# Patient Record
Sex: Female | Born: 1995 | Hispanic: Yes | Marital: Single
Health system: Midwestern US, Academic
[De-identification: ages and names within clinical notes are randomized; demographics above are authoritative.]

---

## 2017-03-09 ENCOUNTER — Encounter: Admit: 2017-03-09 | Discharge: 2017-03-10 | Payer: BC Managed Care – PPO

## 2017-03-10 ENCOUNTER — Encounter: Admit: 2017-03-10 | Discharge: 2017-03-10 | Payer: BC Managed Care – PPO

## 2017-03-10 DIAGNOSIS — K851 Biliary acute pancreatitis without necrosis or infection: Secondary | ICD-10-CM

## 2017-03-10 DIAGNOSIS — S35299A Unspecified injury of branches of celiac and mesenteric artery, initial encounter: ICD-10-CM

## 2017-03-11 ENCOUNTER — Encounter: Admit: 2017-03-11 | Discharge: 2017-03-11 | Payer: BC Managed Care – PPO

## 2017-03-11 ENCOUNTER — Encounter: Admit: 2017-03-11 | Discharge: 2017-03-12 | Payer: BC Managed Care – PPO

## 2017-03-11 ENCOUNTER — Inpatient Hospital Stay: Admit: 2017-03-11 | Discharge: 2017-03-11 | Payer: BC Managed Care – PPO

## 2017-03-11 DIAGNOSIS — R1011 Right upper quadrant pain: Secondary | ICD-10-CM

## 2017-03-11 LAB — COMPREHENSIVE METABOLIC PANEL
Lab: 0.6 mg/dL (ref 0.4–1.00)
Lab: 1.8 mg/dL — ABNORMAL HIGH (ref 0.3–1.2)
Lab: 140 MMOL/L (ref 137–147)
Lab: 6 g/dL — ABNORMAL HIGH (ref 6.0–8.0)
Lab: 8.4 mg/dL — ABNORMAL LOW (ref 8.5–10.6)
Lab: 9 mg/dL (ref 7–25)
Lab: 95 mg/dL (ref 70–100)

## 2017-03-11 LAB — PREGNANCY TEST-URINE
Lab: 1
Lab: NEGATIVE

## 2017-03-11 LAB — PHOSPHORUS: Lab: 2.1 mg/dL (ref 2.0–4.0)

## 2017-03-11 LAB — PROTIME INR (PT): Lab: 1.4 M/UL — ABNORMAL HIGH (ref 0.8–1.2)

## 2017-03-11 LAB — LACTIC ACID (BG - RAPID LACTATE): Lab: 0.9 MMOL/L (ref 0.5–2.0)

## 2017-03-11 LAB — CBC AND DIFF: Lab: 14 10*3/uL — ABNORMAL HIGH (ref 4.5–11.0)

## 2017-03-11 LAB — MAGNESIUM: Lab: 1.8 mg/dL (ref 1.6–2.6)

## 2017-03-11 LAB — PTT (APTT): Lab: 32 s — ABNORMAL LOW (ref 20.0–36.0)

## 2017-03-11 MED ORDER — SODIUM CHLORIDE 0.9 % IR SOLN
0 refills | Status: DC
Start: 2017-03-11 — End: 2017-03-13
  Administered 2017-03-12: 02:00:00 35 mL

## 2017-03-11 MED ORDER — PIPERACILLIN-TAZOBACTAM-DEXTRS 4.5 GRAM/100 ML IV PGBK
4.5 g | INTRAVENOUS | 0 refills | Status: DC
Start: 2017-03-11 — End: 2017-03-15
  Administered 2017-03-11 – 2017-03-15 (×15): 4.5 g via INTRAVENOUS

## 2017-03-11 MED ORDER — IOPAMIDOL 61 % IV SOLN
0 refills | Status: DC
Start: 2017-03-11 — End: 2017-03-13
  Administered 2017-03-12: 02:00:00 35 mL via INTRAMUSCULAR
  Administered 2017-03-13: 02:00:00 50 mL via INTRAMUSCULAR

## 2017-03-11 MED ORDER — CEFTRIAXONE 1 GRAM IJ SOLR
0 refills | Status: DC
Start: 2017-03-11 — End: 2017-03-11
  Administered 2017-03-11: 22:00:00 1 g via INTRAVENOUS

## 2017-03-11 MED ORDER — MORPHINE 2 MG/ML IV CRTG
2-4 mg | INTRAVENOUS | 0 refills | Status: DC | PRN
Start: 2017-03-11 — End: 2017-03-16
  Administered 2017-03-11 (×2): 2 mg via INTRAVENOUS
  Administered 2017-03-11 (×2): 4 mg via INTRAVENOUS
  Administered 2017-03-12 (×2): 2 mg via INTRAVENOUS
  Administered 2017-03-13 (×4): 4 mg via INTRAVENOUS
  Administered 2017-03-14: 15:00:00 2 mg via INTRAVENOUS
  Administered 2017-03-14 (×2): 4 mg via INTRAVENOUS
  Administered 2017-03-15 (×2): 2 mg via INTRAVENOUS

## 2017-03-11 MED ORDER — LACTATED RINGERS IV SOLP
2000 mL | Freq: Once | INTRAVENOUS | 0 refills | Status: CP
Start: 2017-03-11 — End: ?
  Administered 2017-03-11: 12:00:00 2000 mL via INTRAVENOUS

## 2017-03-11 MED ORDER — LACTATED RINGERS IV SOLP
1000 mL | INTRAVENOUS | 0 refills | Status: DC
Start: 2017-03-11 — End: 2017-03-12
  Administered 2017-03-11: 22:00:00 1000.000 mL via INTRAVENOUS
  Administered 2017-03-11 – 2017-03-12 (×3): 1000 mL via INTRAVENOUS

## 2017-03-11 MED ORDER — LIDOCAINE (PF) 200 MG/10 ML (2 %) IJ SYRG
0 refills | Status: DC
Start: 2017-03-11 — End: 2017-03-11
  Administered 2017-03-11: 22:00:00 80 mg via INTRAVENOUS

## 2017-03-11 MED ORDER — FENTANYL CITRATE (PF) 50 MCG/ML IJ SOLN
50 ug | INTRAVENOUS | 0 refills | Status: DC | PRN
Start: 2017-03-11 — End: 2017-03-12
  Administered 2017-03-11: 23:00:00 50 ug via INTRAVENOUS

## 2017-03-11 MED ORDER — PROMETHAZINE 25 MG/ML IJ SOLN
6.25 mg | INTRAVENOUS | 0 refills | Status: DC | PRN
Start: 2017-03-11 — End: 2017-03-12

## 2017-03-11 MED ORDER — PROPOFOL INJ 10 MG/ML IV VIAL
0 refills | Status: DC
Start: 2017-03-11 — End: 2017-03-11
  Administered 2017-03-11 (×2): 20 mg via INTRAVENOUS
  Administered 2017-03-11: 22:00:00 160 mg via INTRAVENOUS

## 2017-03-11 MED ORDER — PHENOL 1.4 % MM SPRA
2 | OROMUCOSAL | 0 refills | Status: DC | PRN
Start: 2017-03-11 — End: 2017-03-16
  Administered 2017-03-12: 02:00:00 2 via OROMUCOSAL

## 2017-03-11 MED ORDER — HYDROMORPHONE (PF) 2 MG/ML IJ SYRG
.5 mg | INTRAVENOUS | 0 refills | Status: DC | PRN
Start: 2017-03-11 — End: 2017-03-12

## 2017-03-11 MED ORDER — SODIUM CHLORIDE 0.9 % IV SOLP
1000 mL | INTRAVENOUS | 0 refills | Status: DC
Start: 2017-03-11 — End: 2017-03-11

## 2017-03-11 MED ORDER — PHENYLEPHRINE IN 0.9% NACL(PF) 1 MG/10 ML (100 MCG/ML) IV SYRG
INTRAVENOUS | 0 refills | Status: DC
Start: 2017-03-11 — End: 2017-03-11
  Administered 2017-03-11: 22:00:00 150 ug via INTRAVENOUS

## 2017-03-11 MED ORDER — SUCCINYLCHOLINE CHLORIDE 20 MG/ML IJ SOLN
INTRAVENOUS | 0 refills | Status: DC
Start: 2017-03-11 — End: 2017-03-11
  Administered 2017-03-11: 22:00:00 100 mg via INTRAVENOUS

## 2017-03-11 MED ORDER — ONDANSETRON HCL (PF) 4 MG/2 ML IJ SOLN
INTRAVENOUS | 0 refills | Status: DC
Start: 2017-03-11 — End: 2017-03-11
  Administered 2017-03-11: 22:00:00 4 mg via INTRAVENOUS

## 2017-03-11 MED ORDER — ONDANSETRON HCL (PF) 4 MG/2 ML IJ SOLN
4 mg | Freq: Once | INTRAVENOUS | 0 refills | Status: DC | PRN
Start: 2017-03-11 — End: 2017-03-12

## 2017-03-11 MED ORDER — DEXAMETHASONE SODIUM PHOSPHATE 4 MG/ML IJ SOLN
INTRAVENOUS | 0 refills | Status: DC
Start: 2017-03-11 — End: 2017-03-11
  Administered 2017-03-11: 22:00:00 4 mg via INTRAVENOUS

## 2017-03-11 MED ORDER — DEXTRAN 70-HYPROMELLOSE (PF) 0.1-0.3 % OP DPET
0 refills | Status: DC
Start: 2017-03-11 — End: 2017-03-11
  Administered 2017-03-11: 22:00:00 2 [drp] via OPHTHALMIC

## 2017-03-11 MED ORDER — PROCHLORPERAZINE EDISYLATE 5 MG/ML IJ SOLN
10 mg | INTRAVENOUS | 0 refills | Status: DC | PRN
Start: 2017-03-11 — End: 2017-03-16
  Administered 2017-03-11 – 2017-03-13 (×2): 10 mg via INTRAVENOUS

## 2017-03-11 MED ORDER — ACETAMINOPHEN 325 MG PO TAB
650 mg | ORAL | 0 refills | Status: DC | PRN
Start: 2017-03-11 — End: 2017-03-13
  Administered 2017-03-11 – 2017-03-12 (×3): 650 mg via ORAL

## 2017-03-11 MED ORDER — FENTANYL CITRATE (PF) 50 MCG/ML IJ SOLN
0 refills | Status: DC
Start: 2017-03-11 — End: 2017-03-11
  Administered 2017-03-11: 22:00:00 50 ug via INTRAVENOUS
  Administered 2017-03-11: 22:00:00 25 ug via INTRAVENOUS
  Administered 2017-03-11: 22:00:00 100 ug via INTRAVENOUS
  Administered 2017-03-11: 22:00:00 25 ug via INTRAVENOUS
  Administered 2017-03-11: 22:00:00 50 ug via INTRAVENOUS

## 2017-03-11 MED ORDER — SODIUM CHLORIDE 0.9 % IV SOLP
INTRAVENOUS | 0 refills | Status: CN
Start: 2017-03-11 — End: ?

## 2017-03-11 MED ORDER — IBUPROFEN 600 MG PO TAB
600 mg | ORAL | 0 refills | Status: DC | PRN
Start: 2017-03-11 — End: 2017-03-11

## 2017-03-11 NOTE — Progress Notes
Patient arrived to room # [phone]([id number]) via cart accompanied by RN. Patient transferred to the bed without assistance. Bedside safety checks completed. Initial patient assessment completed, refer to flowsheet for details. Admission skin assessment completed by: Eulogio BearStephanie A, RN and Orlena SheldonKeyonna A    Pressure Injury Present on Hospital Admission (within 24 hours): No    1. Occiput: No  2. Ear: No  3. Scapula: No  4. Spinous Process: No  5. Shoulder: No  6. Elbow: No  7. Iliac Crest: No  8. Sacrum/Coccyx: No  9. Ischial Tuberosity: No  10. Trochanter: No  11. Knee: No  12. Malleolus: No  13. Heel: No  14. Toes: No  15. Assessed for device associated injury Yes  16. Nursing Nutrition Assessment Completed Yes    See Doc Flowsheet for additional wound details.     INTERVENTIONS: N/A

## 2017-03-11 NOTE — Care Coordination-Inpatient
Transferring from:  Hannah Juarez    Reason for transfer: gallstone pancreatitis    Ms. Hannah Juarez os a 21 yo F, 2 weeks post-partum found to have gallstone pancreatitis.  She is thought to need an ERCP and the sending physician spoke with [facility] on-call who felt she was appropriate for transfer.  Lipase > 8,000, ast 251, alt 321, bilirubin 4.1, wbc 12.8  She is on Zosyn. CT abd and abdominal US being uploaded to the cloud    Consider keeping NPO after midnight, continuing zosyn.  Place GI/Biliary consult.    NOTE:    Radiology and medical records will be sent with the patient.    The above information was obtained via conversation by the  transfer nurse with the sending provider.  Treatment decisions should not be made solely on the basis of this information

## 2017-03-11 NOTE — H&P (View-Only)
Pre Procedure History and Physical/Sedation Plan    Hannah, Juarez                                                                   MRN: [id number]                 DOB:1995-12-05          Age: 21 y.o.  Admission Date: 03/11/2017             Days Admitted: LOS: 0 days      LOS: 0 days    Assessment:         Planned Procedure(s):  03/11/17  Indication:  Gallstone pancreatitis.   Sedation/Medication Plan: General Anesthesia  Discussion/Reviews:  Physician has discussed risks and alternatives of this type of sedation and above planned procedures with patient  Notes:     __________________________________________________________________    Principal Problem:    Pancreatitis due to biliary obstruction  Active Problems:    Cholecystitis    Sepsis (HCC)      Chief Complaint:  Pancreatitis and abdominal pain and jaundice.     History of Present Illness: Hannah Juarez, Hannah Juarez is a 21 y.o. female. With gallstone pancreatitis and juandice.     Previous Anesthetic/Sedation History:  Reviewed.     History reviewed. No pertinent past medical history.  History reviewed. No pertinent surgical history.  Pertinent medical/surgical history reviewed  Social History     Social History   ??? Marital status: N/A     Spouse name: N/A   ??? Number of children: N/A   ??? Years of education: N/A     Social History Main Topics   ??? Smoking status: Never Smoker   ??? Smokeless tobacco: Never Used   ??? Alcohol use No   ??? Drug use: No   ??? Sexual activity: Not on file     Other Topics Concern   ??? Not on file     Social History Narrative   ??? No narrative on file     Family history reviewed; non-contributory  Allergies:  Patient has no known allergies.  Medications:  Scheduled Meds:  piperacillin/tazobactam  (ZOSYN) 4.5 g/100 mL iso-osmotic IVPB 4.5 g Intravenous Q6H*   Continuous Infusions:  ??? lactated ringers infusion 1,000 mL (03/11/17 1045)     PRN and Respiratory Meds:acetaminophen Q4H PRN, morphine  injection syringe Q3H PRN, prochlorperazine Q6H PRN Review of Systems:  Gastrointestinal: negative, positive for abdominal pain and jaundice     Vital Signs:  Last Filed Vital Signs: 24 Hour Range   BP: 123/73 (09/01 1525)  Temp: 38.1 ???C (100.5 ???F) (09/01 1525)  Pulse: 117 (09/01 1525)  Respirations: 16 PER MINUTE (09/01 1525)  SpO2: 94 % (09/01 1525)  O2 Delivery: None (Room Air) (09/01 1525)  Height: 167.6 cm (66) (09/01 0522) BP: (121-126)/(66-78)   Temp:  [38 ???C (100.4 ???F)-39.4 ???C (102.9 ???F)]   Pulse:  [110-134]   Respirations:  [16 PER MINUTE-20 PER MINUTE]   SpO2:  [92 %-95 %]   O2 Delivery: None (Room Air)   Intensity Pain Scale 0-10 (Pain 1): 5 (03/11/17 1610)  Physical Exam:  General:  Alert, cooperative, no distress, appears stated age    Airway:  airway assessment performed  Mallampati II (soft palate, uvula, fauces visible)  Anesthesia Classification:  ASA III (A patient with a severe systemic disease that limits activity, but is not incapacitating)  NPO Status: Acceptable  Pregnancy Status: Not Pregnant    Lab/Radiology/Other Diagnostic Tests:  Labs:  Pertinent labs reviewed  CXR:  Not obtained  Consults: Not obtained    Bernita Buffy, MD  Pager [phone]

## 2017-03-11 NOTE — Anesthesia Post-Procedure Evaluation
Post-Anesthesia Evaluation    Name: Herschel SenegalOlvia Lopez,Eleasha      MRN: [id number]     DOB: 1996/07/06     Age: 21 y.o.     Sex: female   __________________________________________________________________________     Procedure Date: 03/11/2017  Procedure: Procedure(s):  ESOPHAGOGASTRODUODENOSCOPY ENDOSCOPIC ULTRASOUND  CHOLANGIOPANCREATOGRAPHY ENDOSCOPY RETROGRADE      Surgeon: Moishe SpiceSurgeon(s):  Bernita Buffylyaee, Mojtaba, MD    Post-Anesthesia Vitals  BP: 127/68 (09/01 1830)  Temp: 37.9 C (100.2 F) (09/01 1815)  Pulse: 110 (09/01 1830)  Respirations: 24 PER MINUTE (09/01 1830)  SpO2: 100 % (09/01 1830)  O2 Delivery: None (Room Air) (09/01 1830)  SpO2 Pulse: 109 (09/01 1830)      Post Anesthesia Evaluation Note    Evaluation location: Pre/Post  Patient participation: recovered; patient participated in evaluation  Level of consciousness: alert    Pain score: 0  Pain management: adequate    Hydration: normovolemia  Temperature: 36.0C - 38.4C  Airway patency: adequate    Perioperative Events  Perioperative events:  no       Post-op nausea and vomiting: no PONV    Postoperative Status  Cardiovascular status: hemodynamically stable  Respiratory status: spontaneous ventilation        Perioperative Events  Perioperative Event: No  Emergency Case Activation: No

## 2017-03-11 NOTE — Consults
[facility] Consult  03/11/2017     Patient: Hannah Juarez, Hannah Juarez  MRN: [id number]    Admission Date:  03/11/2017, LOS: 0 days  Admission Diagnosis: Acute biliary pancreatitis, unspecified complication status [K85.10]  Date of Service: March 11, 2017    Reason for Consult: Gallstone pancreatitis  Referring Provider: Luster Landsberg, MD  Attending Surgeon: Marin Comment, MD  Consult Performed by: Dalphine Handing, MD    Assessment/Plan  21 y.o. female who is 2 weeks post-partum with gallstone pancreatitis    OR date: 03/11/2017    Principal Problem:    Pancreatitis due to biliary obstruction  Active Problems:    Cholecystitis    Sepsis (HCC)      - Agree with antibiotics and ERCP today for possible stone extraction  - Recommend  continuing to trend bili and lipase to ensure levels are coming down  - Recommend getting images from OSH transferred   - Patient will need a cholecystectomy - possibly early this week    Discussed patient with Dr. Barbera Setters who directed plan of care  __________________________________________________________________________________    HPI   Hannah, Juarez is a 21 y.o. female with a two day history of RUQ abdominal pain. She reports the pain started in the morning and was not related to eating. She also had nausea/vomiting and decreased appetite. She presented to an OSH and was reportedly found to have an elevated lipase (2444), WBC 12.8, and Tbili 4.2. US/CT at OSH consistent with cholecystitis and pancreatitis (images currently not available). She reports that her urine is darker but she hasn't had any changes in her stools.    History reviewed. No pertinent past medical history.  History reviewed. No pertinent surgical history.  Family History   Problem Relation Age of Onset   ??? None Reported Mother    ??? None Reported Father      Social History     Social History   ??? Marital status: N/A     Spouse name: N/A   ??? Number of children: N/A   ??? Years of education: N/A Social History Main Topics   ??? Smoking status: Never Smoker   ??? Smokeless tobacco: Never Used   ??? Alcohol use No   ??? Drug use: No   ??? Sexual activity: Not on file     Other Topics Concern   ??? Not on file     Social History Narrative   ??? No narrative on file       Patient has no known allergies.    No current facility-administered medications on file prior to encounter.      No current outpatient prescriptions on file prior to encounter.       ROS  Review of Systems   Constitutional: Positive for diaphoresis. Negative for chills and fever.   HENT: Negative.    Eyes: Negative.    Respiratory: Negative.    Cardiovascular: Negative.    Gastrointestinal: Positive for abdominal pain, nausea and vomiting. Negative for blood in stool, constipation and diarrhea.   Genitourinary: Negative.    Musculoskeletal: Negative.    Skin: Negative.    Neurological: Negative.  Negative for weakness.   Endo/Heme/Allergies: Negative.    Psychiatric/Behavioral: Negative.        Vitals  BP: (121-126)/(66-78)   Temp:  [38 ???C (100.4 ???F)-39.4 ???C (102.9 ???F)]   Pulse:  [110-134]   Respirations:  [18 PER MINUTE-20 PER MINUTE]   SpO2:  [92 %-95 %]   O2 Delivery: None (  Room Air)    Physical Exam   General: alert, oriented, NAD  HEENT: normocephalic/atraumatic, non-icteric  Cardio: mild tachycardia  Pulm: non-labored respirations on RA  Abd: soft, non-distended, mild TTP in RUQ with voluntary guarding     Ext: warm, dry, no edema/cyanosis   Neuro: grossly intact  Psych: behavior and mood appropriate      Labs/Radiology/Other Diagnostic Tests  Lab Results   Component Value Date/Time    HGB 11.7 (L) 03/11/2017 0620    WBC 14.0 (H) 03/11/2017 0620     Lab Results   Component Value Date/Time    NA 140 03/11/2017 0620    K 3.5 03/11/2017 0620    CL 106 03/11/2017 0620    CO2 24 03/11/2017 0620    BUN 9 03/11/2017 0620    CR 0.65 03/11/2017 8295       Lise Auer, MD  [phone]    Team pager [phone]

## 2017-03-11 NOTE — H&P (View-Only)
Admission History and Physical Examination      Name:  Hannah Juarez, Hannah Juarez                                             MRN:  [id number]   Admission Date:  03/11/2017                     Assessment/Plan:    Principal Problem:    Pancreatitis due to biliary obstruction    Gallstone Pancreatitis  -presented with RUQ abd pain, NV, elevated lipase  -Labs from 8/31: Tbili 4.2, Ap 234, AST 251,ALT321, lipase 2444, WBC 12.8,  -[place] XBJ:[id number] with gallbladder wall thickening and pericholecystic fluid with positive sonographic murphy sign. Findings consistet withh cholecystitis  -CT abd/pelvis:diffusely edematous pancreatic parenchyma with surrounding inflammatory stranding, consistent with acute interstitial edematous pancreatitis. No necrosis. No fluid collections. Distended gallbladder. No biliary ductal dilatation.   -CTA chest: no PE  Plan:  -will consult gastro-biliary for possible ERCP. Patient is NPO  -continue zosyn  -will obtain BCx  -tylenol, ibuprofen, IV morphine prn for pain  -IV compazine prn for nausea    FEN:LR @100ml /hr for 2 L, replete as needed, NPO  Code:Full  Dispo: admit to medicine  PPX: hold chemical PPX in case of procedure  __________________________________________________________________________________  Primary Care Physician: No primary care provider on file.  PCP Unknown    Chief Complaint:  Gallstone panccreatitis  History of Present Illness: Hannah, Juarez is a 21 y.o. female with no significant PMHx, 2 weeks postpartum, presenting as transfer from for management of gallstone pancreatitis. She initally presented with RUQ abdominal pain, nausea, vomiting(non-bloody). She was found to have a lipase of 8000. Ct showed evidence of [ericholecystic fluid and thickening with CBD 3.12mm. Bilirubin and LFTs were elevated and she had leukocytosis. GI was consulted and recommended ERCP.     History reviewed. No pertinent past medical history. History reviewed. No pertinent surgical history.  Family history reviewed; non-contributory  Social History     Social History   ??? Marital status: N/A     Spouse name: N/A   ??? Number of children: N/A   ??? Years of education: N/A     Social History Main Topics   ??? Smoking status: Never Smoker   ??? Smokeless tobacco: Never Used   ??? Alcohol use No   ??? Drug use: No   ??? Sexual activity: Not on file     Other Topics Concern   ??? Not on file     Social History Narrative   ??? No narrative on file      Immunizations (includes history and patient reported):   There is no immunization history on file for this patient.        Allergies:  Patient has no allergy information on record.    Medications:  No prescriptions prior to admission.     Review of Systems:  A 14 point review of systems was negative except for: Gastrointestinal: positive for abdominal pain    Physical Exam:  Vital Signs: Last Filed In 24 Hours Vital Signs: 24 Hour Range   BP: 126/66 (09/01 0522)  Temp: 39.4 ???C (102.9 ???F) (09/01 0522)  Pulse: 131 (09/01 0522)  Respirations: 20 PER MINUTE (09/01 0522)  SpO2: 93 % (09/01 0522)  O2 Delivery: None (Room Air) (09/01 0522)  Height: 167.6 cm (66) (09/01  0522) BP: (126)/(66)   Temp:  [39.4 ???C (102.9 ???F)]   Pulse:  [131]   Respirations:  [20 PER MINUTE]   SpO2:  [93 %]   O2 Delivery: None (Room Air)   Intensity Pain Scale 0-10 (Pain 1): 6 (03/11/17 0500)      General:  Alert, cooperative, no distress, appears stated age  Head:  Normocephalic, without obvious abnormality, atraumatic  Eyes:  Conjunctivae/corneas clear.  PERRL, EOMs intact.   Neck:    Supple,  Lungs:  Clear to auscultation bilaterally  Heart:   Regular rate and rhythm, S1, S2 normal,tachycardic  Abdomen:  Soft,mild tenderness to palpation of RUQ  Extremities: Extremities normal, atraumatic, no cyanosis or edema  Peripheral pulses   2+ and symmetric, all extremities  Skin: Skin color, texture, turgor normal.  No rashes or lesions Neurologic:   CNII - XII intact.   Psych:  Appropriate affect and mood    Lab/Radiology/Other Diagnostic Tests:  24-hour labs:  No results found for this visit on 03/11/17 (from the past 24 hour(s)).     Pertinent radiology reviewed.    Mliss Fritz, MD  Pager

## 2017-03-12 ENCOUNTER — Encounter: Admit: 2017-03-12 | Discharge: 2017-03-12 | Payer: BC Managed Care – PPO

## 2017-03-12 ENCOUNTER — Inpatient Hospital Stay: Admit: 2017-03-12 | Discharge: 2017-03-12 | Payer: BC Managed Care – PPO

## 2017-03-12 DIAGNOSIS — R1011 Right upper quadrant pain: Secondary | ICD-10-CM

## 2017-03-12 LAB — COMPREHENSIVE METABOLIC PANEL
Lab: 0.5 mg/dL (ref 0.4–1.00)
Lab: 0.9 mg/dL (ref 0.3–1.2)
Lab: 107 MMOL/L (ref 98–110)
Lab: 131 U/L — ABNORMAL HIGH (ref 25–110)
Lab: 140 MMOL/L (ref 137–147)
Lab: 2.9 g/dL — ABNORMAL LOW (ref 3.5–5.0)
Lab: 3.3 MMOL/L — ABNORMAL LOW (ref 3.5–5.1)
Lab: 38 U/L (ref 7–40)
Lab: 5.6 g/dL — ABNORMAL LOW (ref 6.0–8.0)
Lab: 8.1 mg/dL — ABNORMAL LOW (ref 8.5–10.6)
Lab: 9 mg/dL (ref 7–25)
Lab: 93 mg/dL (ref 70–100)

## 2017-03-12 LAB — LIPASE: Lab: 80 U/L (ref 11–82)

## 2017-03-12 LAB — CBC
Lab: 12 10*3/uL — ABNORMAL HIGH (ref 4.5–11.0)
Lab: 14 % (ref 11–15)
Lab: 226 10*3/uL (ref 150–400)
Lab: 28 pg (ref 26–34)
Lab: 3.3 M/UL — ABNORMAL LOW (ref 4.0–5.0)
Lab: 9.7 g/dL — ABNORMAL LOW (ref 12.0–15.0)

## 2017-03-12 MED ORDER — PHENYLEPHRINE IV DRIP (STD CONC)
0 refills | Status: DC
Start: 2017-03-12 — End: 2017-03-13
  Administered 2017-03-13 (×2): 0.2 ug/kg/min via INTRAVENOUS

## 2017-03-12 MED ORDER — DIPHENHYDRAMINE HCL 50 MG/ML IJ SOLN
25 mg | Freq: Once | INTRAVENOUS | 0 refills | Status: AC | PRN
Start: 2017-03-12 — End: ?

## 2017-03-12 MED ORDER — ALBUMIN, HUMAN 5 % 250 ML IV SOLP (AN)(OSM)
0 refills | Status: DC
Start: 2017-03-12 — End: 2017-03-13
  Administered 2017-03-12 – 2017-03-13 (×2): via INTRAVENOUS

## 2017-03-12 MED ORDER — BUPIVACAINE LIPOSOMAL/BUPIVACAINE HCL INJECTION
Freq: Once | 0 refills | Status: CP
Start: 2017-03-12 — End: ?
  Administered 2017-03-13 (×2): 60 mL

## 2017-03-12 MED ORDER — PROPOFOL INJ 10 MG/ML IV VIAL
0 refills | Status: DC
Start: 2017-03-12 — End: 2017-03-13
  Administered 2017-03-12: 23:00:00 200 mg via INTRAVENOUS

## 2017-03-12 MED ORDER — SODIUM CHLORIDE 0.9 % IV SOLP
0 refills | Status: DC
Start: 2017-03-12 — End: 2017-03-13
  Administered 2017-03-12 – 2017-03-13 (×2): via INTRAVENOUS

## 2017-03-12 MED ORDER — CALCIUM CHLORIDE 100 MG/ML (10 %) IV SOLN
0 refills | Status: DC
Start: 2017-03-12 — End: 2017-03-13
  Administered 2017-03-13 (×2): 500 mg via INTRAVENOUS

## 2017-03-12 MED ORDER — ROCURONIUM 10 MG/ML IV SOLN
INTRAVENOUS | 0 refills | Status: DC
Start: 2017-03-12 — End: 2017-03-13
  Administered 2017-03-12: 23:00:00 50 mg via INTRAVENOUS
  Administered 2017-03-12: 23:00:00 20 mg via INTRAVENOUS
  Administered 2017-03-13: 01:00:00 50 mg via INTRAVENOUS

## 2017-03-12 MED ORDER — HALOPERIDOL LACTATE 5 MG/ML IJ SOLN
1 mg | Freq: Once | INTRAVENOUS | 0 refills | Status: AC | PRN
Start: 2017-03-12 — End: ?

## 2017-03-12 MED ORDER — SUCCINYLCHOLINE CHLORIDE 20 MG/ML IJ SOLN
INTRAVENOUS | 0 refills | Status: DC
Start: 2017-03-12 — End: 2017-03-13
  Administered 2017-03-12: 23:00:00 120 mg via INTRAVENOUS

## 2017-03-12 MED ORDER — PHENYLEPHRINE IN 0.9% NACL(PF) 1 MG/10 ML (100 MCG/ML) IV SYRG
INTRAVENOUS | 0 refills | Status: DC
Start: 2017-03-12 — End: 2017-03-13
  Administered 2017-03-12: 23:00:00 100 ug via INTRAVENOUS
  Administered 2017-03-12 – 2017-03-13 (×3): 200 ug via INTRAVENOUS
  Administered 2017-03-13: 100 ug via INTRAVENOUS
  Administered 2017-03-13: 200 ug via INTRAVENOUS

## 2017-03-12 MED ORDER — FENTANYL CITRATE (PF) 50 MCG/ML IJ SOLN
50 ug | INTRAVENOUS | 0 refills | Status: DC | PRN
Start: 2017-03-12 — End: 2017-03-13
  Administered 2017-03-13 (×2): 50 ug via INTRAVENOUS

## 2017-03-12 MED ORDER — LIDOCAINE (PF) 200 MG/10 ML (2 %) IJ SYRG
0 refills | Status: DC
Start: 2017-03-12 — End: 2017-03-13
  Administered 2017-03-12: 23:00:00 100 mg via INTRAVENOUS

## 2017-03-12 MED ORDER — ELECTROLYTE-A IV SOLP
0 refills | Status: DC
Start: 2017-03-12 — End: 2017-03-13
  Administered 2017-03-13 (×3): via INTRAVENOUS

## 2017-03-12 MED ORDER — DEXTROSE 5%-0.45% SODIUM CHLORIDE & POTASSIUM CHLORIDE 20 MEQ/L IV SOLP
INTRAVENOUS | 0 refills | Status: AC
Start: 2017-03-12 — End: ?
  Administered 2017-03-12 – 2017-03-14 (×5): 1000.000 mL via INTRAVENOUS

## 2017-03-12 MED ORDER — LIDOCAINE HCL 10 MG/ML (1 %) IJ SOLN
0 refills | Status: DC
Start: 2017-03-12 — End: 2017-03-13
  Administered 2017-03-12: 8.5 mL via INTRAMUSCULAR

## 2017-03-12 MED ORDER — SUGAMMADEX 100 MG/ML IV SOLN
INTRAVENOUS | 0 refills | Status: DC
Start: 2017-03-12 — End: 2017-03-13
  Administered 2017-03-13: 03:00:00 224 mg via INTRAVENOUS

## 2017-03-12 MED ORDER — HYDROMORPHONE (PF) 2 MG/ML IJ SYRG
.5 mg | INTRAVENOUS | 0 refills | Status: DC | PRN
Start: 2017-03-12 — End: 2017-03-13
  Administered 2017-03-13 (×3): 0.5 mg via INTRAVENOUS

## 2017-03-12 MED ORDER — OXYCODONE 5 MG PO TAB
5-10 mg | Freq: Once | ORAL | 0 refills | Status: CP | PRN
Start: 2017-03-12 — End: ?
  Administered 2017-03-13: 04:00:00 10 mg via ORAL

## 2017-03-12 MED ORDER — LACTATED RINGERS IV SOLP
1000 mL | INTRAVENOUS | 0 refills | Status: DC
Start: 2017-03-12 — End: 2017-03-13
  Administered 2017-03-12: 22:00:00 1000 mL via INTRAVENOUS
  Administered 2017-03-13: 1000.000 mL via INTRAVENOUS

## 2017-03-12 MED ORDER — BUPIVACAINE 0.25 % (2.5 MG/ML) IJ SOLN
0 refills | Status: DC
Start: 2017-03-12 — End: 2017-03-13
  Administered 2017-03-12: 8.5 mL via INTRAMUSCULAR

## 2017-03-12 MED ORDER — CEFOXITIN 2 GRAM IV SOLR
0 refills | Status: DC
Start: 2017-03-12 — End: 2017-03-13
  Administered 2017-03-12 – 2017-03-13 (×2): 2 g via INTRAVENOUS

## 2017-03-12 MED ORDER — FENTANYL CITRATE (PF) 50 MCG/ML IJ SOLN
0 refills | Status: DC
Start: 2017-03-12 — End: 2017-03-13
  Administered 2017-03-12: 23:00:00 50 ug via INTRAVENOUS
  Administered 2017-03-12: 23:00:00 100 ug via INTRAVENOUS
  Administered 2017-03-12 – 2017-03-13 (×3): 50 ug via INTRAVENOUS

## 2017-03-12 MED ORDER — MIDAZOLAM 1 MG/ML IJ SOLN
INTRAVENOUS | 0 refills | Status: DC
Start: 2017-03-12 — End: 2017-03-13
  Administered 2017-03-12 – 2017-03-13 (×2): 2 mg via INTRAVENOUS

## 2017-03-12 MED ORDER — DEXTRAN 70-HYPROMELLOSE (PF) 0.1-0.3 % OP DPET
0 refills | Status: DC
Start: 2017-03-12 — End: 2017-03-13
  Administered 2017-03-12: 23:00:00 2 [drp] via OPHTHALMIC

## 2017-03-12 MED ORDER — ONDANSETRON HCL (PF) 4 MG/2 ML IJ SOLN
INTRAVENOUS | 0 refills | Status: DC
Start: 2017-03-12 — End: 2017-03-13
  Administered 2017-03-13: 02:00:00 4 mg via INTRAVENOUS

## 2017-03-12 MED ORDER — HYDROMORPHONE 2 MG/ML IJ SOLN
0 refills | Status: DC
Start: 2017-03-12 — End: 2017-03-13
  Administered 2017-03-12 – 2017-03-13 (×5): .4 mg via INTRAVENOUS

## 2017-03-12 NOTE — Progress Notes
0400: Pts. O2 while sleeping was 89-90% on RA. This RN placed pt. on 1 L of oxygen nasal cannula and pt. bumped up to 93%.     0415: Pt. Placed on 2 liters of oxygen and is resting at 95-96%.

## 2017-03-12 NOTE — Progress Notes
General Progress Note    Name:  Hannah Juarez, Hannah Juarez   ZOXWR'U Date:  03/11/2017  Admission Date: 03/11/2017  LOS: 0 days                     Assessment/Plan:    Principal Problem:    Pancreatitis due to biliary obstruction  Active Problems:    Cholecystitis    Sepsis (HCC)    21 yoF  no significant past medical history, 2 weeks postpartum, was transferred to [facility] for further management of gallstone pancreatitis.    Gallstone pancreatitis, cholecystitis, sepsis, tranaminitis  - fevers, leukocytosis, tachycardia, improving  - lipase 8000 at [facility], now normal  - LFTs improving  - Abdominal ultrasound shows gallbladder wall thickening with pericholecystic fluid and positive sonographic Murphy, consistent with cholecystitis  - CT AP diffuse edematous pancreatic parenchyma and surrounding inflammatory stranding.  No biliary ductal dilatation   - 9/1 s/p ERCP with choledocholithiasis and GB sludge removed, PD stent placed.  EUS w/o celiac nodes of notable pathology in liver.  Plan:  - GI consulted, repeat KUB in 2wk to verify passage of PD stent.  If not passed, will need EGD for stent removal.  - Surgery consulted, trend lipase and LFTs, timing of surgery TBD  - Continue Zosyn  - Continue IVF  - Pain control    FEN: D5 1/2 NS 20 KCl 125/hr, BMP QD, NPO  Code: Full  PPx: SCDs only  Dispo: Continue inpatient.    Medical complexity high due to sepsis due to gallstone pancreatitis and cholecystitis requiring IV abx, urgent ERCP for stone extraction; ERCP & EUS report reviewed, coordination of care with GI and Surgery.    Vernetta Honey, DO  MPL [phone]  ________________________________________________________________________    Subjective  Hannah Juarez, Hannah Juarez is a 21 y.o. female.  Patient reports improvement in pain.  Febrile last night.  Mouth dry.  No nausea or vomiting.  BM last night.    No chest pain or shortness of breath.  No dysuria.    Medications  Scheduled Meds: piperacillin/tazobactam  (ZOSYN) 4.5 g/100 mL iso-osmotic IVPB 4.5 g Intravenous Q6H*   Continuous Infusions:  ??? lactated ringers infusion Stopped (03/11/17 1739)     PRN and Respiratory Meds:[MAR Hold] acetaminophen Q4H PRN, fentaNYL citrate PF Q5 MIN PRN, HYDROmorphone (DILAUDID) injection Q10 MIN PRN, [MAR Hold] morphine  injection syringe Q3H PRN, ondansetron (ZOFRAN) IV Once PRN, [MAR Hold] prochlorperazine Q6H PRN, promethazine Q10 MIN PRN    Objective:                          Vital Signs: Last Filed                 Vital Signs: 24 Hour Range   BP: 127/68 (09/01 1830)  Temp: 37.9 ???C (100.2 ???F) (09/01 1815)  Pulse: 110 (09/01 1830)  Respirations: 24 PER MINUTE (09/01 1830)  SpO2: 100 % (09/01 1830)  O2 Delivery: None (Room Air) (09/01 1830)  SpO2 Pulse: 109 (09/01 1830)  Height: 167.6 cm (66) (09/01 0522) BP: (117-133)/(54-78)   Temp:  [37.9 ???C (100.2 ???F)-39.4 ???C (102.9 ???F)]   Pulse:  [110-134]   Respirations:  [16 PER MINUTE-28 PER MINUTE]   SpO2:  [90 %-100 %]   O2 Delivery: None (Room Air)   Intensity Pain Scale 0-10 (Pain 1): 5 (03/11/17 1815) Vitals:    03/11/17 0522   Weight: 111.8 kg (246 lb 8 oz)  Intake/Output Summary:  (Last 24 hours)    Intake/Output Summary (Last 24 hours) at 03/11/17 1946  Last data filed at 03/11/17 1827   Gross per 24 hour   Intake             3020 ml   Output              400 ml   Net             2620 ml      Stool Occurrence: 1    Physical Exam  General: Alert, cooperative, no distress, appears stated age   Neck: Supple, nontender, no JVD or lymphadenopathy  Resp: Clear to auscultation bilaterally   CV: Regular rate and rhythm, S1, S2 normal, no murmur.  No peripheral edema.  GI: Soft, non-tender to deep palpation. Obese.  Bowel sounds normal.  Neuro: No focal deficits, CN 2-12 grossly intact bilaterally, oriented x3  Psych: normal affect and mood, remote and recent memory intact      Lab Review  Pertinent labs reviewed    Point of Care Testing  (Last 24 hours) Glucose: 95 (03/11/17 4540)    Radiology and other Diagnostics Review:    Pertinent radiology reviewed.

## 2017-03-12 NOTE — H&P (View-Only)
Interval History and Physical Note 03/12/2017    Patient has no known allergies.    BP 119/61 (BP Source: Arm, Right Upper)  - Pulse 84  - Temp 37.2 C (98.9 F)  - Ht 167.6 cm (66")  - Wt 111.8 kg (246 lb 8 oz)  - SpO2 100%  - BMI 39.79 kg/m     Recent Labs      03/11/17   0620  03/12/17   0830   HGB  11.7*  9.7*   HCT  36.0  29.6*   WBC  14.0*  12.3*   PLTCT  268  226   NA  140  140   K  3.5  3.3*   CL  106  107   CO2  24  25   BUN  9  9   CR  0.65  0.59   GLU  95  93   CA  8.4*  8.1*   MG  1.8   --    PO4  2.1   --    ALBUMIN  3.2*  2.9*   TOTPROT  6.0  5.6*   TOTBILI  1.8*  0.9   AST  110*  38   ALT  212*  122*   ALKPHOS  194*  131*   LIPASE   --   80   INR  1.4*   --    PTT  32.3   --    Glucose: 93 (03/12/17 0830)    I have examined the patient and there have been no significant changes in condition since H&P performed on 03/11/2017    NPO  Consented and agreeable to proceed    Casimiro NeedleMichael A. Costantino Kohlbeck, MD  Team pager [phone]

## 2017-03-12 NOTE — Progress Notes
Acute Care Surgery Progress Note 03/12/2017     Patient: Hannah Juarez, Hannah Juarez  MRN: [id number]  Admission date: 03/11/2017, LOS: 1 day    Assessment/Plan  Laura, Caldas is a 21 y.o. Female with Acute biliary pancreatitis, unspecified complication status [K85.10]     Principal Problem:    Pancreatitis due to biliary obstruction  Active Problems:    Cholecystitis    Sepsis (HCC)      Will follow up with labs to evaluate timing of surgical intervention.    Diet: NPO  Pain regimen: tylenol prn, morphine injection prn  Prophylaxis: SCD/IS  Dispo: Plan for OR within this week.    Patient seen and discussed with Dr. Allyson Sabal who directed the plan of care  ________________________________________________________________________   Subjective  No acute events overnight. Pain tolerable on current regimen.     Objective  BP 114/59 (BP Source: Arm, Right Upper)  - Pulse 93  - Temp 37.6 ???C (99.6 ???F)  - Ht 167.6 cm (66)  - Wt 111.8 kg (246 lb 8 oz)  - SpO2 96%  - BMI 39.79 kg/m???     Physical Exam  Gen: alert and oriented, in no distress  HEENT: normocephalic, atraumatic. No lacerations or lesions  Card: RRR no murmurs  Pulm: CTAB bilaterally, no wheezes or rales  GI: Abdomen soft. Tender to palpation in epigastrium and RUQ  Extremities: skin warm and dry. NVI to bilateral upper and lower extremities  Neuro: grossly intact    Labs  Recent Labs      03/11/17   0620  03/12/17   0830   HGB  11.7*  9.7*   HCT  36.0  29.6*   WBC  14.0*  12.3*   PLTCT  268  226   NA  140  140   K  3.5  3.3*   CL  106  107   CO2  24  25   BUN  9  9   CR  0.65  0.59   GLU  95  93   CA  8.4*  8.1*   MG  1.8   --    PO4  2.1   --    ALBUMIN  3.2*  2.9*   TOTPROT  6.0  5.6*   TOTBILI  1.8*  0.9   AST  110*  38   ALT  212*  122*   ALKPHOS  194*  131*   LIPASE   --   80   INR  1.4*   --    PTT  32.3   --    Glucose: 93 (03/12/17 0830)    Intake/Output Summary (Last 24 hours) at 03/12/17 1101  Last data filed at 03/12/17 0958   Gross per 24 hour Intake             3775 ml   Output              650 ml   Net             3125 ml   Stool Occurrence: 0       A. , MD  Team pager [phone]

## 2017-03-13 LAB — POC HEMATOCRIT
Lab: 19 % — ABNORMAL LOW (ref 36–45)
Lab: 21 % — ABNORMAL LOW (ref 36–45)
Lab: 29 % — ABNORMAL LOW (ref 36–45)
Lab: 6.5 g/dL — ABNORMAL LOW (ref 12.0–15.0)
Lab: 7.1 g/dL — ABNORMAL LOW (ref 12.0–15.0)
Lab: 9.9 g/dL — ABNORMAL LOW (ref 12.0–15.0)
Lab: <15 % — ABNORMAL LOW (ref 36–45)
Lab: <15 % — ABNORMAL LOW (ref 36–45)

## 2017-03-13 LAB — POC BLOOD GAS ARTERIAL
Lab: 1 MMOL/L
Lab: 1 MMOL/L
Lab: 100 % — ABNORMAL HIGH (ref 95–99)
Lab: 2 MMOL/L
Lab: 23 MMOL/L (ref 21–28)
Lab: 23 MMOL/L (ref >60)
Lab: 27 MMOL/L (ref 21–28)
Lab: 322 mmHg — ABNORMAL HIGH (ref 80–100)
Lab: 35 mmHg (ref 35–45)
Lab: 36 mmHg (ref 35–45)
Lab: 40 mmHg — ABNORMAL LOW (ref 35–45)
Lab: 41 mmHg (ref 35–45)
Lab: 44 mmHg (ref 35–45)
Lab: 7.3 (ref 7.35–7.45)
Lab: 7.3 U/L (ref 7.35–7.45)
Lab: 7.4 (ref 7.35–7.45)
Lab: 7.4 (ref 7.35–7.45)
Lab: 7.4 (ref 7.35–7.45)

## 2017-03-13 LAB — BASIC METABOLIC PANEL
Lab: 0.5 mg/dL (ref 0.4–1.00)
Lab: 110 mg/dL — ABNORMAL HIGH (ref 70–100)
Lab: 140 MMOL/L — ABNORMAL LOW (ref 137–147)
Lab: 6 mg/dL — ABNORMAL LOW (ref 7–25)
Lab: 6 pg (ref 3–12)
Lab: >60 mL/min — ABNORMAL HIGH (ref >60)
Lab: >60 mL/min — ABNORMAL LOW (ref >60)

## 2017-03-13 LAB — POC SODIUM
Lab: 141 MMOL/L (ref 137–147)
Lab: 141 MMOL/L (ref 137–147)
Lab: 142 MMOL/L (ref 137–147)
Lab: 142 MMOL/L (ref 137–147)
Lab: 143 MMOL/L (ref 137–147)

## 2017-03-13 LAB — POC IONIZED CALCIUM
Lab: 0.8 MMOL/L — ABNORMAL LOW (ref 1.0–1.3)
Lab: 0.8 MMOL/L — ABNORMAL LOW (ref 1.0–1.3)
Lab: 0.8 MMOL/L — ABNORMAL LOW (ref 1.0–1.3)
Lab: 0.9 MMOL/L — ABNORMAL LOW (ref 1.0–1.3)
Lab: 1 MMOL/L (ref 1.0–1.3)

## 2017-03-13 LAB — POC POTASSIUM
Lab: 3.3 MMOL/L — ABNORMAL LOW (ref 3.5–5.1)
Lab: 3.4 MMOL/L — ABNORMAL LOW (ref 3.5–5.1)
Lab: 3.7 MMOL/L (ref 3.5–5.1)
Lab: 3.7 MMOL/L (ref 3.5–5.1)
Lab: 4.1 MMOL/L (ref 3.5–5.1)

## 2017-03-13 LAB — PTT (APTT): Lab: 24 s — ABNORMAL LOW (ref 20.0–36.0)

## 2017-03-13 LAB — CBC AND DIFF
Lab: 11 10*3/uL — ABNORMAL HIGH (ref 4.5–11.0)
Lab: 5 % (ref 4–12)

## 2017-03-13 LAB — COMPREHENSIVE METABOLIC PANEL
Lab: 107 MMOL/L — ABNORMAL HIGH (ref >60)
Lab: 138 MMOL/L — ABNORMAL LOW (ref >60)

## 2017-03-13 LAB — IONIZED CALCIUM: Lab: 1 MMOL/L (ref 1.0–1.3)

## 2017-03-13 LAB — CBC: Lab: 9.8 K/UL — ABNORMAL HIGH (ref 4.5–11.0)

## 2017-03-13 LAB — POC GLUCOSE: Lab: 142 mg/dL — ABNORMAL HIGH (ref 70–100)

## 2017-03-13 LAB — PROTIME INR (PT): Lab: 1.2 MMOL/L — ABNORMAL LOW (ref 0.8–1.2)

## 2017-03-13 LAB — FIBRINOGEN: Lab: 408 mg/dL — ABNORMAL HIGH (ref 200–400)

## 2017-03-13 MED ORDER — OXYCODONE 5 MG PO TAB
5-15 mg | ORAL | 0 refills | Status: DC | PRN
Start: 2017-03-13 — End: 2017-03-16
  Administered 2017-03-13 (×2): 10 mg via ORAL
  Administered 2017-03-14 – 2017-03-16 (×12): 15 mg via ORAL

## 2017-03-13 MED ORDER — OXYCODONE 5 MG PO TAB
5-10 mg | ORAL | 0 refills | Status: DC | PRN
Start: 2017-03-13 — End: 2017-03-13
  Administered 2017-03-13: 13:00:00 5 mg via ORAL

## 2017-03-13 MED ORDER — FENTANYL CITRATE (PF) 50 MCG/ML IJ SOLN
25-50 ug | INTRAVENOUS | 0 refills | Status: DC | PRN
Start: 2017-03-13 — End: 2017-03-13
  Administered 2017-03-13 (×4): 50 ug via INTRAVENOUS

## 2017-03-13 MED ORDER — ACETAMINOPHEN 325 MG PO TAB
650 mg | Freq: Once | ORAL | 0 refills | Status: CP
Start: 2017-03-13 — End: ?
  Administered 2017-03-13: 15:00:00 650 mg via ORAL

## 2017-03-13 NOTE — Progress Notes
03/13/17 0758   Vitals   Temp (!) 38.7 C (101.7 F)  (Rn Mica notified)   Temperature Source Oral   Pulse 107   Respirations 18 PER MINUTE   SpO2 96 %   O2 Delivery RA   BP 122/64   Mean NBP (Calculated) 83 MM HG   NBP MAP (Manual Entry) 79 mm Hg   BP Source Arm, Right Upper   BP Patient Position Head of Bed (Comment Degree)   Dr. Mariea StableGiangrego aware of the  Above vital sign. Order received. Will continue to monitor

## 2017-03-13 NOTE — Anesthesia Procedure Notes
Anesthesia Procedure: Arterial Line Placement    A-LINE INSERTION    Patient location: OR  Indications: frequent labs and hemodynamic monitoring        Arterial Line Procedure   Patient sedated: yes (see MAR)  Artery prepped with chlorhexidine; skin prep agent completely dried prior to procedure.  Location: radial artery  Laterality: left  Technique: palpation  Ultrasound image captured  Needle gauge: 20 G  Number of attempts: 1    Procedure Outcome  Catheter secured with adhesive dressing applied  Events: no complications noted during insertion    Observation: pt tolerated well        Performed by: Daisey MustEETZ, Aleene Swanner DIAN  Authorized by: Clare GandyUPANOVIC, MIRSAD

## 2017-03-13 NOTE — Other
Brief Operative Note    Name: Herschel SenegalOlvia Lopez Anaka is a 21 y.o. female     DOB: 1996-03-26             MRN#: [id number]  DATE OF OPERATION: 03/12/2017    Date:  03/12/2017        Preoperative Dx:   Acute biliary pancreatitis, unspecified complication status [K85.10]    Post-op Diagnosis      * Acute biliary pancreatitis, unspecified complication status [K85.10]    Procedure(s):  LAPAROSCOPIC CHOLECYSTECTOMY converted to open, INTRAOPERATIVE cholangiogram, REPAIR OF RIGHT HEPATIC ARTERY    Anesthesia Type: Defer to Anesthesia    Surgeon(s) and Role:     Fransico Setters* Berry, Stepheny, MD - Primary     * Verlon AuHessel, Carolene Gitto, DO - Resident - Assisting      Findings:    1) injury to right hepatic artery when removing gallbladder from fossa, opened, repaired primarily  2) cholangiogram without injury to ductal system    Estimated Blood Loss: 1300 ml    Specimen(s) Removed/Disposition:   ID Type Source Tests Collected by Time Destination   1 : gallbladder for routine Tissue Gallbladder SURGICAL PATHOLOGY          Fransico SettersBerry, Stepheny, MD 03/12/2017 1842        Complications:  None    Implants: None    Drains: Jackson-Pratt Drain: #1 = 5 mL    Disposition:  PACU - stable    Verlon AuKara Davien Malone, DO  Pager [phone]

## 2017-03-13 NOTE — Anesthesia Post-Procedure Evaluation
Post-Anesthesia Evaluation    Name: Hannah Juarez      MRN: [id number]     DOB: 11/02/1995     Age: 21 y.o.     Sex: female   __________________________________________________________________________     Procedure Date: 03/12/2017  Procedure: Procedure(s):  LAPAROSCOPIC CHOLECYSTECTOMY converted to open, INTRAOPERATIVE cholangiogram, REPAIR OF RIGHT HEPATIC ARTERY      Surgeon: Surgeon(s):  Fransico SettersBerry, Stepheny, MD  Verlon AuHessel, Kara, DO    Post-Anesthesia Vitals  BP: 124/80 (09/02 2330)  Pulse: 98 (09/02 2330)  Respirations: 24 PER MINUTE (09/02 2330)  SpO2: 95 % (09/02 2330)  O2 Delivery: Nasal Cannula (09/02 2330)  SpO2 Pulse: 98 (09/02 2330)      Post Anesthesia Evaluation Note    Evaluation location: Pre/Post  Patient participation: recovered; patient participated in evaluation  Level of consciousness: alert    Pain score: 4  Pain management: adequate    Hydration: normovolemia  Temperature: 36.0C - 38.4C  Airway patency: adequate    Regional/Neuraxial:       Neurological status: sensory deficit      Single injection shot performed    Perioperative Events  Perioperative events:  no       Post-op nausea and vomiting: no PONV    Postoperative Status  Cardiovascular status: hemodynamically stable  Respiratory status: spontaneous ventilation and supplemental oxygen (2L O2 via NC)  Follow-up needed: none        Perioperative Events  Perioperative Event: No  Emergency Case Activation: No

## 2017-03-13 NOTE — Progress Notes
Shift:Day    Nutrition:CLD    Pain:c/o continuous abdominal soreness-pain controlled with PRN pain med    Activity:OOBx1    CV:VSS, temperature monitoring    Pulm:1 L NC    Neuro:A&Ox4    GI/GU:voids    Follow-up: continue plan of care

## 2017-03-13 NOTE — Progress Notes
General Progress Note    Name:  Hannah Juarez   Today's Date:  03/13/2017  Admission Date: 03/11/2017  LOS: 2 days                     Assessment/Plan:    Principal Problem:    Pancreatitis due to biliary obstruction  Active Problems:    Cholecystitis    Sepsis (HCC)    21 yoF  no significant past medical history, 2 weeks postpartum, was transferred to [facility] for further management of gallstone pancreatitis.    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  - fever again 9/3, leukocytosis, tachycardia, improving  - lipase 8000 at [facility], now normal  - LFTs fluctuating  - Abdominal ultrasound shows gallbladder wall thickening with pericholecystic fluid and positive sonographic Eulah Pont, consistent with cholecystitis  - CT AP diffuse edematous pancreatic parenchyma and surrounding inflammatory stranding.  No biliary ductal dilatation   - 9/1 s/p ERCP with choledocholithiasis and GB sludge removed, PD stent placed.  EUS w/o celiac nodes of notable pathology in liver.  - Intraoperative cholangiogram 9/2 showed no damage to biliary ductal system.  JP drain in place.  Plan:  - GI consulted, repeat KUB in 2wk to verify passage of PD stent.  If not passed, will need EGD for stent removal.  - Surgery consulted, started on CLD, managing drain  - repeat BCx, Continue Zosyn  - Continue IVF  - Pain control with oxy 5-15 and morphine IV for breakthrough  - Incentive spirometry    Hypocalcemia  - likely due to low ablumin, corrects to 8.5, ionized Ca 1.0    FEN: D5 1/2 NS 20 KCl 125/hr, BMP QD, CLD, advance per surgery.  Code: Full  PPx: SCDs only  Dispo: Continue inpatient.    Medical complexity high due to sepsis due to gallstone pancreatitis and cholecystitis requiring IV abx, urgent ERCP for stone extraction; ERCP & EUS report reviewed, s/p laparoscopic cholecystectomy complicated by injury of hepatic artery requiring conversion to laparotomy and primary repair, pain control with parenteral narcotics, coordination of care with Surgery and GI.    Vernetta Honey, DO  MPL [phone]  ________________________________________________________________________    Subjective  Hannah Juarez is a 21 y.o. female.  Fever this morning.  Poor sleep last night due to severe last night.  Was not getting oxy, only morphine.  Tolerating jello this morning.    No chest pain or shortness of breath.  No dysuria.    Medications  Scheduled Meds:    piperacillin/tazobactam  (ZOSYN) 4.5 g/100 mL iso-osmotic IVPB 4.5 g Intravenous Q6H*   Continuous Infusions:  ??? dextrose  5 % & 0.45% NaCl with KCl 20 mEq/L infusion 125 mL/hr at 03/13/17 0107     PRN and Respiratory Meds:acetaminophen Q4H PRN, fentaNYL citrate PF Q1H PRN, morphine  injection syringe Q3H PRN, phenol PRN, prochlorperazine Q6H PRN    Objective:                          Vital Signs: Last Filed                 Vital Signs: 24 Hour Range   BP: 127/65 (09/03 0433)  Temp: 37.5 ???C (99.5 ???F) (09/03 9604)  Pulse: 105 (09/03 0433)  Respirations: 6 PER MINUTE (09/03 0433)  SpO2: 97 % (09/03 0433)  O2  Delivery: Nasal Cannula (09/03 0433)  SpO2 Pulse: 102 (09/02 2345) BP: (113-134)/(59-84)   ABP: (136-156)/(68-77)   Temp:  [36.8 ???C (98.2 ???F)-37.6 ???C (99.6 ???F)]   Pulse:  [84-107]   Respirations:  [6 PER MINUTE-30 PER MINUTE]   SpO2:  [92 %-100 %]   O2 Delivery: Nasal Cannula   Intensity Pain Scale 0-10 (Pain 1): 8 (03/13/17 0345) Vitals:    03/11/17 0522   Weight: 111.8 kg (246 lb 8 oz)       Intake/Output Summary:  (Last 24 hours)    Intake/Output Summary (Last 24 hours) at 03/13/17 0700  Last data filed at 03/13/17 0650   Gross per 24 hour   Intake            10593 ml   Output             4270 ml   Net             6323 ml      Stool Occurrence: 0    Physical Exam  General: Alert, cooperative, ill-appearing   Resp: Clear to auscultation bilaterally CV: Regular rhythm, tachycardic, S1, S2 normal, no murmur.  Trace pedal edema.  GI: Soft, TTP at incision site in RUQ, no rebound or guarding.  JP drain with serosanguinous fluid.  Bowel sounds normal x4.  Neuro: No focal deficits, CN 2-12 grossly intact bilaterally, oriented x3  Psych: normal affect and mood, remote and recent memory intact      Lab Review  Pertinent labs reviewed    Point of Care Testing  (Last 24 hours)  Glucose: (!) 110 (03/12/17 2203)    Radiology and other Diagnostics Review:    Pertinent radiology reviewed.

## 2017-03-13 NOTE — Progress Notes
Shift: Night    Mentation: A&Ox4    Cardiac: S1, S2    Respiratory: Non-labored, Lungs CTA, 2 L nc    GI/GU: Voids, Last BM: PTA    Nutrition: Clear liquids    Activity: Ad lib    Pain: Pain in throat and abd, PRN meds given    Family: Friend at bedside     Hygiene: Not done this shift    Follow up: Ongoing plan of care

## 2017-03-13 NOTE — Progress Notes
Acute Care Surgery Progress Note 03/13/2017     Patient: Hannah Juarez  MRN: [id number]  Admission date: 03/11/2017, LOS: 2 days    Assessment/Plan  Hannah Juarez is a 21 y.o. Female with Acute biliary pancreatitis, unspecified complication status [K85.10]  Acute biliary pancreatitis, unspecified complication status [K85.10]     Principal Problem:    Pancreatitis due to biliary obstruction  Active Problems:    Cholecystitis    Sepsis (HCC)    53F who is 2 weeks postpartum admitted for gallstone pancreatitis s/p lap cholecystectomy 03/12/17    Continue current pain regimen for pain control   Recommend heating pad  Continue Zosyn     Diet: ADAT to regular  Pain regimen: TAPS in OR, tylenol prn, oxycodone prn, morphine and fentanyl IV prn  Prophylaxis: SCD/IS  Dispo: continue admit to 46    Patient seen and discussed with Dr. Allyson Sabal who directed the plan of care  ________________________________________________________________________   Subjective  No acute events overnight. Pain not controlled. However was not receiving oral oxycodone. She denies nausea and vomiting.     Objective  BP 122/64 (BP Source: Arm, Right Upper)  - Pulse 107  - Temp (!) 38.7 ???C (101.7 ???F) Comment: Rn Mica notified - Ht 167.6 cm (66)  - Wt 111.8 kg (246 lb 8 oz)  - SpO2 96%  - BMI 39.79 kg/m???     Physical Exam  Gen: alert and oriented, in no distress  HEENT: normocephalic, atraumatic. No lacerations or lesions  Card: regular rate, regular rhythm  Pulm: non-labored respirations on RA  GI: soft, non-distended, mild RUQ TTP, incisions c/d/i  Extremities: warm, dry, no cyanosis/edema  Neuro: grossly intact    Labs  Recent Labs      03/11/17   0620  03/12/17   0830  03/12/17   2203  03/13/17   0631   HGB  11.7*  9.7*  9.8*  9.9*   HCT  36.0  29.6*  29.6*  30.0*   WBC  14.0*  12.3*  11.5*  9.8   PLTCT  268  226  187  212   NA  140  140  140  138   K  3.5  3.3*  3.6  3.6   CL  106  107  108  107   CO2  24  25  26  26    BUN  9  9  6*  5* CR  0.65  0.59  0.56  0.54   GLU  95  93  110*  109*   CA  8.4*  8.1*  7.8*  7.4*   MG  1.8   --    --    --    PO4  2.1   --    --    --    ALBUMIN  3.2*  2.9*   --   2.6*   TOTPROT  6.0  5.6*   --   4.8*   TOTBILI  1.8*  0.9   --   0.8   AST  110*  38   --   102*   ALT  212*  122*   --   117*   ALKPHOS  194*  131*   --   71   LIPASE   --   80   --    --    INR  1.4*   --   1.2   --    PTT  32.3   --   24.7   --    Glucose: (!) 109 (03/13/17 0631)    Intake/Output Summary (Last 24 hours) at 03/13/17 0942  Last data filed at 03/13/17 0800   Gross per 24 hour   Intake            10593 ml   Output             4000 ml   Net             6593 ml   Stool Occurrence: 0      Dalphine Handing, MD  Team pager [phone]

## 2017-03-14 LAB — CBC: Lab: 9.4 K/UL — ABNORMAL HIGH (ref 4.5–11.0)

## 2017-03-14 LAB — COMPREHENSIVE METABOLIC PANEL: Lab: 137 MMOL/L — ABNORMAL LOW (ref 137–147)

## 2017-03-14 MED ORDER — SENNOSIDES 8.6 MG PO TAB
1 | Freq: Two times a day (BID) | ORAL | 0 refills | Status: DC
Start: 2017-03-14 — End: 2017-03-16
  Administered 2017-03-14 – 2017-03-15 (×3): 1 via ORAL

## 2017-03-14 MED ORDER — POLYETHYLENE GLYCOL 3350 17 GRAM PO PWPK
1 | Freq: Every day | ORAL | 0 refills | Status: DC
Start: 2017-03-14 — End: 2017-03-16
  Administered 2017-03-14 – 2017-03-16 (×2): 17 g via ORAL

## 2017-03-14 NOTE — Progress Notes
Patient stated that oxycodone (15 mg) was not reducing her pain while morphine (4mg  IV) was more effective.

## 2017-03-14 NOTE — Progress Notes
PHYSICAL THERAPY  ASSESSMENT/DISCHARGE     MOBILITY:  Mobility  Progressive Mobility Level: Walk in hallway  Distance Walked (feet): 400 ft  Level of Assistance: Independent  Assistive Device: None  Time Tolerated: 11-30 minutes  Activity Limited By: Pain    SUBJECTIVE:  Subjective  Significant hospital events: s/p open cholecystectomy & R hepatic artery repair 9/2. Pt recently had a baby; she is 25 weeks old.   Mental / Cognitive Status: Alert;Oriented  Pain: Patient complains of pain;5/10;Before activity;8/10;During activity  Pain Location: Abdomen  Pain Description: Aching  Pain Interventions: Patient pre-medicated;Patient agrees to participate in therapy;Treatment altered to patient's pain tolerance  Ambulation Assist: Independent Mobility in Community without Device  Home Situation:  (Lives with boyfriend but will discharge to her mother's home)  Type of Home: House  Entry Stairs: No Stairs  In-Home Stairs: Able to Live on One Level  Comments: Her mother is able to assist her in caring for her infant & 2 y.o. while she recovers.     ROM:  ROM  LE ROM: Bilateral;WFL    STRENGTH:  Strength  Overall Strength: WFL    BED MOBILITY/TRANSFERS:  Bed Mobility/Transfers  Bed Mobility: Rolling: Standby Assist;Bed Flat (logroll for abdominal comfort)  Bed Mobility: Supine to Sit: Independent;Bed Flat;Requires Extra Time  Transfer Type: Sit to Stand  Transfer: Assistance Level: From;Bed;Independent  Transfer: Assistive Device: None  Other Transfer Type: Sit to/from Stand  Other Transfer: Assistance Level: To/From;Toilet;Modified Independent  Other Transfer: Assistive Device:  (Left grab bar)  End Of Activity Status: Up in Chair;Nursing Notified    GAIT:  Gait  Gait Distance: 400 feet  Gait: Assistance Level: Independent  Gait: Assistive Device: None  Gait: Descriptors: Pace: Slow;No balance loss  Stairs: Number Climbed: 1  Stairs: Descriptors: Non-Reciprocal  Stairs: Assistance Level: Modified Independent Stairs: Assistive Device: One Data processing manager Limited By:  (none)    ACTIVITY/EXERCISE:  Activity / Exercise  Comments: Patient performed toileting, pericare & hand hygiene indep at the beginning & end of our session.     EDUCATION:  Education  Persons Educated: Patient  Patient Barriers To Learning: None Noted  Teaching Methods: Verbal Instruction  Patient Response: Return Demonstration  Topics: Importance of Increasing Activity;Continue Ambulation on Own    ASSESSMENT/PROGRESS:  Assessment/Progress  Impaired Mobility Due To: Pain  Assessment/Progress: Patient level of independence and safety is consistent with prior level of function and does not require physical therapy intervention;Discontinue PT     Discussed with bedside RN about inquiring with surgical team if patient has any post surgical lifting restrictions as she has an infant at home.     PLAN:  Plan   Plan Frequency: No Further Treatment    RECOMMENDATIONS:  PT Discharge Recommendations  PT Discharge Recommendations: Home;No Further PT Indicated  Equipment Recommendations: None       Therapist: Doretha Sou, PT, DPT  Date: 03/14/2017

## 2017-03-14 NOTE — Anesthesia Pain Rounding
Anesthesia Follow-Up Evaluation: Post-Procedure Day One    Name: Hannah Juarez     MRN: [id number]     DOB: 1996-06-13     Age: 21 y.o.     Sex: female   __________________________________________________________________________     Procedure Date: 03/12/2017   Procedure: Procedure(s):  LAPAROSCOPIC CHOLECYSTECTOMY converted to open, INTRAOPERATIVE cholangiogram, REPAIR OF RIGHT HEPATIC ARTERY    Physical Assessment  Height: 167.6 cm (66)  Weight: 111.8 kg (246 lb 8 oz)    Vital Signs (Last Filed in 24 hours)  BP: 115/67 (09/04 0854)  Temp: 37.9 ???C (100.3 ???F) (09/04 0854)  Pulse: 107 (09/04 0854)  Respirations: 18 PER MINUTE (09/04 0854)  SpO2: 98 % (09/04 0854)  O2 Delivery: None (Room Air) (09/04 2536)    Patient History   Allergies  No Known Allergies     Medications  Scheduled Meds:  piperacillin/tazobactam  (ZOSYN) 4.5 g/100 mL iso-osmotic IVPB 4.5 g Intravenous Q6H*   Continuous Infusions:  PRN and Respiratory Meds:morphine  injection syringe Q3H PRN, oxyCODONE Q4H PRN, phenol PRN, prochlorperazine Q6H PRN      Diagnostic Tests  Hematology: Lab Results   Component Value Date    HGB 9.2 03/14/2017    HCT 26.4 03/14/2017    PLTCT 231 03/14/2017    WBC 9.4 03/14/2017    NEUT 86 03/12/2017    ANC 9.90 03/12/2017    ALC 1.00 03/12/2017    MONA 5 03/12/2017    AMC 0.60 03/12/2017    EOSA 0 03/12/2017    ABC 0.00 03/12/2017    MCV 87.7 03/14/2017    MCH 30.4 03/14/2017    MCHC 34.7 03/14/2017    MPV 9.0 03/14/2017    RDW 15.2 03/14/2017         General Chemistry: Lab Results   Component Value Date    NA 137 03/14/2017    K 3.5 03/14/2017    CL 105 03/14/2017    CO2 26 03/14/2017    GAP 6 03/14/2017    BUN 3 03/14/2017    CR 0.58 03/14/2017    GLU 103 03/14/2017    CA 7.6 03/14/2017    ALBUMIN 2.7 03/14/2017    OBSCA 1.02 03/13/2017    MG 1.8 03/11/2017    TOTBILI 0.5 03/14/2017    PO4 2.1 03/11/2017      Coagulation:   Lab Results   Component Value Date    PTT 24.7 03/12/2017    INR 1.2 03/12/2017 Follow-Up Assessment  Patient location during evaluation: floor      Anesthetic Complications:   Anesthetic complications: The patient did not experience any anesthestic complications.      Pain:  Score: 2    Management:adequate     Level of Consciousness: awake and alert   Hydration:acceptable     Airway Patency: patent   Respiratory Status: acceptable and room air     Cardiovascular Status:acceptable and hemodynamically stable   Regional/Neuroaxial:

## 2017-03-14 NOTE — Progress Notes
Acute Care Surgery Progress Note 03/14/2017     Patient: Hannah Juarez  MRN: [id number]  Admission date: 03/11/2017, LOS: 3 days    Assessment/Plan  Hannah Juarez is a 21 y.o. Female with Acute biliary pancreatitis, unspecified complication status [K85.10]  Acute biliary pancreatitis, unspecified complication status [K85.10]     Principal Problem:    Pancreatitis due to biliary obstruction  Active Problems:    Cholecystitis    Sepsis (HCC)    22F who is 2 weeks postpartum admitted for gallstone pancreatitis s/p lap converted to open cholecystectomy with cholangiogram and primary repair of right hepatic artery 03/12/17    Continue current pain regimen for pain control and utilize heating pad as needed  Continue Zosyn   Will change surgical dressing today  Patient tolerating PO- can d/c IVF from surgical standpoint  Continue ambulation  Wean oxygen  Will pull JP drain prior to discharge  Likely able to discharge in next day or two    Patient discussed with Dr. Dimas Aguas who directed the plan of care  ________________________________________________________________________   Subjective  No acute events overnight. Hemoglobin stable. Enzymes improving. Pain improved but still significant with ambulation. She reports using IS but is currently on 1/2 liter NC. No bowel movement recorded since prior to admission. No nausea/vomiting. Tolerating diet.     Objective  BP 114/75 (BP Source: Arm, Right Upper)  - Pulse 109  - Temp 37 ???C (98.6 ???F)  - Ht 167.6 cm (66)  - Wt 111.8 kg (246 lb 8 oz)  - SpO2 96%  - BMI 39.79 kg/m???     Physical Exam  Gen: alert, cooperative, NAD  HEENT: normocephalic, atraumatic. No lacerations or lesions  Card: regular rate, regular rhythm  Pulm: mildly dyspneic on 1/2 liter NC   GI: soft, non-distended, mild RUQ TTP mostly incisional, incisions c/d/i, SS output from JP  Extremities: warm, dry, no cyanosis/edema  Neuro: grossly intact    Labs  Recent Labs      03/12/17   0830  03/12/17 2203  03/13/17   0631  03/14/17   0504   HGB  9.7*  9.8*  9.9*  9.2*   HCT  29.6*  29.6*  30.0*  26.4*   WBC  12.3*  11.5*  9.8  9.4   PLTCT  226  187  212  231   NA  140  140  138  137   K  3.3*  3.6  3.6  3.5   CL  107  108  107  105   CO2  25  26  26  26    BUN  9  6*  5*  3*   CR  0.59  0.56  0.54  0.58   GLU  93  110*  109*  103*   CA  8.1*  7.8*  7.4*  7.6*   ALBUMIN  2.9*   --   2.6*  2.7*   TOTPROT  5.6*   --   4.8*  5.1*   TOTBILI  0.9   --   0.8  0.5   AST  38   --   102*  41*   ALT  122*   --   117*  85*   ALKPHOS  131*   --   71  64   LIPASE  80   --    --    --    INR   --   1.2   --    --  PTT   --   24.7   --    --    Glucose: (!) 103 (03/14/17 0504)    Intake/Output Summary (Last 24 hours) at 03/14/17 1256  Last data filed at 03/14/17 1200   Gross per 24 hour   Intake          3560.07 ml   Output             1860 ml   Net          1700.07 ml   Stool Occurrence: 0      Hannah Handing, MD  Team pager [phone]

## 2017-03-14 NOTE — Case Management (ED)
Case Management Admission Assessment    NAME:Hannah Juarez                          MRN: [id number]             DOB:Sep 14, 1995          AGE: 21 y.o.  ADMISSION DATE: 03/11/2017             DAYS ADMITTED: LOS: 3 days      Today???s Date: 03/14/2017    Source of Information: Patient      Plan  Plan: CM Assessment, Assist PRN with SW/NCM Services, Discharge Planning for Home Anticipated    SW met with pt in the room to complete assessment. SW introduced self, explained role, and provided contact information.   Pt resides in [place], [place] with her boyfriend and their 2.5 y/o daughter and 98 week old son. Pt states her mother is staying with her children while she is in the hospital so her boyfriend can work.   Pt states she has insurance coverage through [employer] and [place]. Pt uses Development worker, community.   Pt states her boyfriend or her mother will drive her home at d/c.     Patient Address/Phone  [address] [place]  [place] [address]  There are no phone numbers on file.    Emergency Contact  Extended Emergency Contact Information  Primary Emergency Contact: No,Contact   [place]  Relation: None    Forensic scientist: No, patient does not have a healthcare directive  Would patient like to fill out a (a new) Editor, commissioning?: No, patient declined  Lawyer (Psych unit only): No, patient does not have a Social research officer, government  Does the patient need discharge transport arranged?: No  Transportation Name, Phone and Availability #1: Mother  Transportation Name, Phone and Availability #2: Boyfriend  Does the patient use Medicaid Transportation?: No    Expected Discharge Date  Expected Discharge Date: 03/17/17    Living Situation Prior to Admission  ? Living Arrangements  Type of Residence: Home, independent (Pt resides in [place], [place] home with her boyfriend and their two children ages 2.5 and 3 weeks. Pt states her mother is staying with the children while she is in the hospital. )  Living Arrangements: Spouse/significant other  How many levels in the residence?: 1  Can patient live on one level if needed?: Yes  Does residence have entry and/or side stairs?: Yes  Assistance needed prior to admit or anticipated on discharge: No  ? Level of Function   Prior level of function: Independent  ? Cognitive Abilities   Cognitive Abilities: Alert and Oriented, Engages in problem solving and planning, Participates in decision making    Financial Resources  ? Coverage  Primary Insurance: Clinical cytogeneticist Insurance: Medicaid  Additional Coverage: RX (Pt uses Development worker, community. )    ? Source of Income   Source Of Income: Unemployed  ? Financial Assistance Needed?  None    Psychosocial Needs  ? Mental Health  Mental Health History: No  ? Substance Use History  Substance Use History Screen: No  ? Other  None    Current/Previous Services  ? PCP  No primary care provider on file., None, None  ? Pharmacy    [id number] - [address] [place], [place] - [address]  [address]  [place] [address]  Phone: [phone] Fax: [phone]    ? Durable Medical Equipment   Durable Medical Equipment at home: None  ? Home Health  Receiving home health: No  ? Hemodialysis or Peritoneal Dialysis  Undergoing hemodialysis or peritoneal dialysis: No  ? Tube/Enteral Feeds  Receive tube/enteral feeds: No  ? Infusion  Receive infusions: No  ? Private Duty  Private duty help used: No  ? Home and Community Based Services  Home and community based services: No  ? [employer]: N/A  ? Hospice  Hospice: No  ? Outpatient Therapy  PT: No  OT: No  SLP: No  ? Skilled Nursing Facility/Nursing Home  SNF: No  NH: No  ? Inpatient Rehab  IPR: No  ? Long-Term Acute Care Hospital  LTACH: No  ? Acute Hospital Stay  Acute Hospital Stay: Yes  Was patient's stay within the last 30 days?: No Sharen Hones, LMSW  Social Work Database administrator [phone]  Pager [phone]

## 2017-03-14 NOTE — Progress Notes
General Progress Note    Name:  Hannah Juarez   Today's Date:  03/14/2017  Admission Date: 03/11/2017  LOS: 3 days                     Assessment/Plan:    Principal Problem:    Pancreatitis due to biliary obstruction  Active Problems:    Cholecystitis    Sepsis (HCC)    21 yoF  no significant past medical history, 2 weeks postpartum, was transferred to [facility] for further management of gallstone pancreatitis.    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  - fever again 9/3, leukocytosis, tachycardia, improving  - lipase 8000 at [facility], now normal  - LFTs fluctuating  - Abdominal ultrasound shows gallbladder wall thickening with pericholecystic fluid and positive sonographic Eulah Pont, consistent with cholecystitis  - CT AP diffuse edematous pancreatic parenchyma and surrounding inflammatory stranding.  No biliary ductal dilatation   - 9/1 s/p ERCP with choledocholithiasis and GB sludge removed, PD stent placed.  EUS w/o celiac nodes of notable pathology in liver.  - Intraoperative cholangiogram 9/2 showed no damage to biliary ductal system.  JP drain in place.  Hemoglobin remains stable.  - BCx NGTD  Plan:  - GI consulted, repeat KUB in 2wk to verify passage of PD stent.  If not passed, will need EGD for stent removal.  - Surgery consulted, ADAT, managing JP drain  - Continue Zosyn, will discuss with pharmacy duration, although she had a fever 9/3, she is clinically improving.  Possibly switch to po tomorrow.  - Continue IVF  - Pain control with oxy 5-15 and morphine IV for breakthrough, stool softeners  - Incentive spirometry    Hypocalcemia  - likely due to low ablumin, corrects to 8.5, ionized Ca 1.0    FEN: D5 1/2 NS 20 KCl 75/hr, BMP QD, ADAT to regular  Code: Full  PPx: SCDs  Dispo: Continue inpatient. Medical complexity high due to sepsis due to gallstone pancreatitis and cholecystitis requiring IV abx, urgent ERCP for stone extraction; ERCP & EUS report reviewed, s/p laparoscopic cholecystectomy complicated by injury of hepatic artery requiring conversion to laparotomy and primary repair, pain control with parenteral narcotics, coordination of care with Surgery and GI.    Vernetta Honey, DO  MPL [phone]  ________________________________________________________________________    Subjective  Hannah Juarez is a 21 y.o. female.  Slept better last night but pain is still severe requiring IV pain meds.  Tolerated full liquids yesterday and some cookies this morning.  No BM since 9/2.    No chest pain or shortness of breath.  No dysuria.    Medications  Scheduled Meds:    piperacillin/tazobactam  (ZOSYN) 4.5 g/100 mL iso-osmotic IVPB 4.5 g Intravenous Q6H*   Continuous Infusions:  ??? dextrose  5 % & 0.45% NaCl with KCl 20 mEq/L infusion 125 mL/hr at 03/14/17 0404     PRN and Respiratory Meds:morphine  injection syringe Q3H PRN, oxyCODONE Q4H PRN, phenol PRN, prochlorperazine Q6H PRN    Objective:                          Vital Signs: Last Filed                 Vital Signs: 24 Hour Range   BP: 122/76 (09/04 0353)  Temp: 37.1 ???C (98.7 ???F) (09/04  4540)  Pulse: 102 (09/04 0353)  Respirations: 18 PER MINUTE (09/04 0353)  SpO2: 98 % (09/04 0353)  O2 Delivery: Nasal Cannula (09/04 0353) BP: (111-128)/(64-80)   Temp:  [37.1 ???C (98.7 ???F)-38.7 ???C (101.7 ???F)]   Pulse:  [102-117]   Respirations:  [16 PER MINUTE-18 PER MINUTE]   SpO2:  [96 %-98 %]   O2 Delivery: Nasal Cannula   Intensity Pain Scale 0-10 (Pain 1): 8 (03/14/17 0402) Vitals:    03/11/17 0522   Weight: 111.8 kg (246 lb 8 oz)       Intake/Output Summary:  (Last 24 hours)    Intake/Output Summary (Last 24 hours) at 03/14/17 0651  Last data filed at 03/14/17 0405   Gross per 24 hour   Intake          3971.32 ml   Output             1490 ml   Net          2481.32 ml Stool Occurrence: 0    Physical Exam  General: Alert, cooperative, ill-appearing but improved  Resp: Clear to auscultation bilaterally   CV: Regular rhythm, tachycardic, S1, S2 normal, no murmur.  Trace pedal edema.  GI: Soft, TTP at incision site in RUQ with dressing C/D/I, no rebound or guarding.  JP drain with serosanguinous fluid.  Bowel sounds normal x4.  Neuro: No focal deficits, CN 2-12 grossly intact bilaterally, oriented x3      Lab Review  Pertinent labs reviewed    Point of Care Testing  (Last 24 hours)  Glucose: (!) 103 (03/14/17 0504)  POC Glucose (Download): (!) 142 (03/13/17 1118)    Radiology and other Diagnostics Review:    Pertinent radiology reviewed.

## 2017-03-14 NOTE — Progress Notes
Assumed care of pt. Bedside safety check and assessment complete. Will review and implement interventions. Pt c/o mild pain in her abdomen. RN to administer prn pain medication when needed and available. Pt resting in bed with all immediate needs met. Will cont. to monitor and assess.

## 2017-03-15 ENCOUNTER — Encounter: Admit: 2017-03-15 | Discharge: 2017-03-15 | Payer: BC Managed Care – PPO

## 2017-03-15 ENCOUNTER — Inpatient Hospital Stay: Admit: 2017-03-15 | Discharge: 2017-03-15 | Payer: BC Managed Care – PPO

## 2017-03-15 LAB — CBC: Lab: 8.8 K/UL — ABNORMAL LOW (ref >60)

## 2017-03-15 LAB — COMPREHENSIVE METABOLIC PANEL: Lab: 135 MMOL/L — ABNORMAL LOW (ref 137–147)

## 2017-03-15 MED ORDER — FERROUS GLUCONATE 324 MG (37.5 MG IRON) PO TAB
324 mg | Freq: Every evening | ORAL | 0 refills | Status: DC
Start: 2017-03-15 — End: 2017-03-16
  Administered 2017-03-16: 01:00:00 324 mg via ORAL

## 2017-03-15 NOTE — Progress Notes
Shift:Day    Neuro:Alert and oriented X4    Cardiac:S1,S2    Pulmonary:Room air    GI:BM 9/4    ZO:XWRUEGU:Voids    Skin:Abdominal incision, see O2 doc flow sheet    Nutrition:Regular diet    Activity:Up ad lib    Pain:Complaints of abdominal pain    Family:Family visited    Hygiene:Refused    Plan:Continue to monitor and pass on to the oncoming RN

## 2017-03-15 NOTE — Progress Notes
Acute Care Surgery Progress Note 03/15/2017     Patient: Hannah Juarez  MRN: [id number]  Admission date: 03/11/2017, LOS: 4 days    Assessment/Plan  Zaiah Garbarini is a 21 y.o. Female with Acute biliary pancreatitis, unspecified complication status [K85.10]  Acute biliary pancreatitis, unspecified complication status [K85.10]     Principal Problem:    Pancreatitis due to biliary obstruction  Active Problems:    Cholecystitis    Sepsis (HCC)    30F who is 2 weeks postpartum admitted for gallstone pancreatitis s/p lap converted to open cholecystectomy with cholangiogram and primary repair of right hepatic artery 03/12/17    JP drain removed today   Continue ambulation  Wean oxygen prior to discharge   Follow up appointment scheduled in acute care surgery clinic for 03/30/17 at 9:00 AM, staples to be removed at that time  OK to discharge from surgical standpoint    Patient discussed with Dr. Dimas Aguas who directed the plan of care  ________________________________________________________________________   Subjective  No acute events overnight. She is continuing to feel better, pain is improving and is sitting up in the chair. She has 1 lpm NC on but denies shortness of breath and reports using IS. She is tolerating a diet without nausea/vomiting. She is ambulating but not often. Left arm swelling overnight with LUE US showing superficial thrombus.     Objective  BP 124/74 (BP Source: Arm, Right Upper)  - Pulse 100  - Temp 37.3 ???C (99.1 ???F)  - Ht 167.6 cm (66)  - Wt 111.8 kg (246 lb 8 oz)  - SpO2 98%  - BMI 39.79 kg/m???     Physical Exam  Gen: alert, cooperative, NAD  HEENT: normocephalic, atraumatic. No lacerations or lesions  Card: regular rate, regular rhythm  Pulm: non-labored respirations on 1 lpm NC  GI: soft, non-distended, mild RUQ TTP, incisions c/d/i with staples intact, SS output from JP  Extremities: warm, dry, no cyanosis/edema  Neuro: grossly intact    Labs  Recent Labs      03/12/17   2203  03/13/17 0631  03/14/17   0504  03/15/17   0630   HGB  9.8*  9.9*  9.2*  8.3*   HCT  29.6*  30.0*  26.4*  25.4*   WBC  11.5*  9.8  9.4  8.8   PLTCT  187  212  231  235   NA  140  138  137  135*   K  3.6  3.6  3.5  3.4*   CL  108  107  105  103   CO2  26  26  26  26    BUN  6*  5*  3*  3*   CR  0.56  0.54  0.58  0.52   GLU  110*  109*  103*  77   CA  7.8*  7.4*  7.6*  8.1*   ALBUMIN   --   2.6*  2.7*  2.7*   TOTPROT   --   4.8*  5.1*  5.2*   TOTBILI   --   0.8  0.5  0.5   AST   --   102*  41*  34   ALT   --   117*  85*  70*   ALKPHOS   --   71  64  65   INR  1.2   --    --    --    PTT  24.7   --    --    --    Glucose: 77 (03/15/17 0630)    Intake/Output Summary (Last 24 hours) at 03/15/17 1032  Last data filed at 03/15/17 0806   Gross per 24 hour   Intake             1450 ml   Output             1810 ml   Net             -360 ml   Stool Occurrence: 0      Dalphine Handing, MD  Team pager [phone]

## 2017-03-15 NOTE — Progress Notes
Patient complaining of swelling in left forearm. No redness or warmth noted on assessment, pulses 3+. Dr. Colan Neptuneafique notified, order for venous doppler placed.

## 2017-03-15 NOTE — Progress Notes
Shift: Night    Nutrition: Tolerating regular diet.     Activity: Up ad lib    Pain: Complaints of abdominal pain, PRN oxycodone and morphine given with some relief.     CV: S1, S2, VSS.     Resp: 1 L via NC    Neuro: Alert and oriented x 4.     GI/GU: Voiding, some clots in urine. No BM reported this shift.

## 2017-03-16 ENCOUNTER — Inpatient Hospital Stay: Admit: 2017-03-11 | Discharge: 2017-03-11 | Payer: BC Managed Care – PPO

## 2017-03-16 ENCOUNTER — Inpatient Hospital Stay: Admit: 2017-03-12 | Discharge: 2017-03-12 | Payer: BC Managed Care – PPO

## 2017-03-16 ENCOUNTER — Inpatient Hospital Stay
Admit: 2017-03-11 | Discharge: 2017-03-16 | Disposition: A | Discharge status: Home / Self Care | Payer: BC Managed Care – PPO | Source: Other Acute Inpatient Hospital

## 2017-03-16 DIAGNOSIS — I959 Hypotension, unspecified: ICD-10-CM

## 2017-03-16 DIAGNOSIS — Z6839 Body mass index (BMI) 39.0-39.9, adult: ICD-10-CM

## 2017-03-16 DIAGNOSIS — R188 Other ascites: ICD-10-CM

## 2017-03-16 DIAGNOSIS — R1011 Right upper quadrant pain: Secondary | ICD-10-CM

## 2017-03-16 DIAGNOSIS — M7989 Other specified soft tissue disorders: ICD-10-CM

## 2017-03-16 DIAGNOSIS — I82612 Acute embolism and thrombosis of superficial veins of left upper extremity: ICD-10-CM

## 2017-03-16 DIAGNOSIS — Z5331 Laparoscopic surgical procedure converted to open procedure: ICD-10-CM

## 2017-03-16 DIAGNOSIS — R112 Nausea with vomiting, unspecified: ICD-10-CM

## 2017-03-16 DIAGNOSIS — R000 Tachycardia, unspecified: ICD-10-CM

## 2017-03-16 DIAGNOSIS — K851 Biliary acute pancreatitis without necrosis or infection: ICD-10-CM

## 2017-03-16 DIAGNOSIS — E669 Obesity, unspecified: ICD-10-CM

## 2017-03-16 DIAGNOSIS — K828 Other specified diseases of gallbladder: ICD-10-CM

## 2017-03-16 DIAGNOSIS — A419 Sepsis, unspecified organism: Principal | ICD-10-CM

## 2017-03-16 DIAGNOSIS — D62 Acute posthemorrhagic anemia: ICD-10-CM

## 2017-03-16 DIAGNOSIS — K8063 Calculus of gallbladder and bile duct with acute cholecystitis with obstruction: ICD-10-CM

## 2017-03-16 LAB — COMPREHENSIVE METABOLIC PANEL: Lab: 138 MMOL/L — ABNORMAL LOW (ref >60)

## 2017-03-16 LAB — CBC: Lab: 7.9 K/UL — ABNORMAL HIGH (ref 4.5–11.0)

## 2017-03-16 MED ORDER — POLYETHYLENE GLYCOL 3350 17 GRAM PO PWPK
17 g | Freq: Every day | ORAL | 1 refills | 18.00000 days | Status: AC
Start: 2017-03-16 — End: 2017-03-30

## 2017-03-16 MED ORDER — SENNOSIDES 8.6 MG PO TAB
1 | ORAL_TABLET | Freq: Two times a day (BID) | ORAL | 3 refills | Status: AC
Start: 2017-03-16 — End: ?

## 2017-03-16 MED ORDER — FERROUS GLUCONATE 324 MG (37.5 MG IRON) PO TAB
324 mg | ORAL_TABLET | Freq: Every evening | ORAL | 1 refills | 90.00000 days | Status: AC
Start: 2017-03-16 — End: 2017-03-30

## 2017-03-16 MED ORDER — OXYCODONE 5 MG PO TAB
ORAL_TABLET | ORAL | 0 refills | 6.00000 days | Status: AC | PRN
Start: 2017-03-16 — End: 2017-03-30

## 2017-03-16 NOTE — Progress Notes
Acute Care Surgery Progress Note 03/16/2017     Patient: Hannah Juarez  MRN: [id number]  Admission date: 03/11/2017, LOS: 5 days    Assessment/Plan  Anthony Schaad is a 21 y.o. Female with Acute biliary pancreatitis, unspecified complication status [K85.10]  Acute biliary pancreatitis, unspecified complication status [K85.10]     Principal Problem:    Pancreatitis due to biliary obstruction  Active Problems:    Sepsis (HCC)    Choledocholithiasis with acute cholecystitis    Hepatic artery injury    29F who is 2 weeks postpartum admitted for gallstone pancreatitis s/p lap converted to open cholecystectomy with cholangiogram and primary repair of right hepatic artery 03/12/17    JP drain removed 9/5  Patient is no longer requiring oxygen NC  Plan to discharge with PO pain regimen  No lifting anything >10 pounds for 6 weeks, can shower without dressing but do not submerge incision in water, keep clean and dry, open to air- discussed with med private  OK to discharge from surgical standpoint  Follow up appointment scheduled in acute care surgery clinic for 03/30/17 at 9:00 AM, staples to be removed at that time    Patient discussed with Dr. Dimas Aguas who directed the plan of care  ________________________________________________________________________   Subjective  No acute events overnight. She is sitting up in chair and continues to feel better, pain improving. No nausea/vomiting. Ambulating more frequently. Tolerating regular diet. Weaned off oxygen. Using IS.     Objective  BP 119/71 (BP Source: Arm, Right Upper)  - Pulse 90  - Temp 36.8 ???C (98.3 ???F)  - Ht 167.6 cm (66)  - Wt 111.8 kg (246 lb 8 oz)  - SpO2 97%  - BMI 39.79 kg/m???     Physical Exam  Gen: alert, cooperative, NAD  HEENT: normocephalic, atraumatic. No lacerations or lesions  Card: regular rate, regular rhythm  Pulm: non-labored respirations on 1 lpm NC  GI: soft, non-distended, mild RUQ TTP, incisions c/d/i with staples intact Extremities: warm, dry, no cyanosis/edema  Neuro: grossly intact    Labs  Recent Labs      03/14/17   0504  03/15/17   0630  03/16/17   0547   HGB  9.2*  8.3*  8.5*   HCT  26.4*  25.4*  25.4*   WBC  9.4  8.8  7.9   PLTCT  231  235  253   NA  137  135*  138   K  3.5  3.4*  3.5   CL  105  103  105   CO2  26  26  26    BUN  3*  3*  3*   CR  0.58  0.52  0.59   GLU  103*  77  87   CA  7.6*  8.1*  8.2*   ALBUMIN  2.7*  2.7*  2.8*   TOTPROT  5.1*  5.2*  5.3*   TOTBILI  0.5  0.5  0.4   AST  41*  34  21   ALT  85*  70*  51   ALKPHOS  64  65  63   Glucose: 87 (03/16/17 0547)    Intake/Output Summary (Last 24 hours) at 03/16/17 0908  Last data filed at 03/16/17 0758   Gross per 24 hour   Intake              874 ml   Output  1750 ml   Net             -876 ml   Stool Occurrence: 0      Dalphine Handing, MD  Team pager [phone]

## 2017-03-16 NOTE — Progress Notes
General Progress Note    Name:  Hannah Juarez   ZOXWR'U Date:  03/16/2017  Admission Date: 03/11/2017  LOS: 5 days                     Assessment/Plan:    Principal Problem:    Pancreatitis due to biliary obstruction  Active Problems:    Cholecystitis    Sepsis (HCC)    21 yoF  no significant past medical history, 2 weeks postpartum, was transferred to [place] for further management of gallstone pancreatitis.    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  - fever 9/3, no recurrence off abx, leukocytosis & tachycardia, improved/stable  - lipase 8000 at [facility], now normal  - Abdominal ultrasound shows gallbladder wall thickening with pericholecystic fluid and positive sonographic Murphy, consistent with cholecystitis  - CT AP diffuse edematous pancreatic parenchyma and surrounding inflammatory stranding.  No biliary ductal dilatation   - 9/1 s/p ERCP with choledocholithiasis and GB sludge removed, PD stent placed.  EUS w/o celiac nodes of notable pathology in liver.  - Intraoperative cholangiogram 9/2 showed no damage to biliary ductal system.  JP drain in place.  Hemoglobin remains stable.  - BCx NGTD  Plan:  - GI consulted, repeat KUB in 2wk to verify passage of PD stent.  If not passed, may need to have procedure for stent retrieval.  GI referral for 2 wks placed.  - Surgery consulted, ADAT, JP drain pulled, staples to be removed at f/u 9/20.  - Monitor off abx.  - Pain control with oxy 5-15 and morphine IV for breakthrough, stool softeners  - Incentive spirometry, ambulate, out of bed TID    Acute Blood loss anemia, stable  - continue Ferrous sulfate qhs    LUE swelling  -  [place] LUE with occlusive/near occlusive thrombus within the left basilic vein   (superficial vein) at the high to mid forearm, likely due to IV, removed.  Warm compresses, elevation.    Hypocalcemia - likely due to low ablumin, corrects to 8.5, ionized Ca 1.0    FEN: No IVF, BMP QD, regular  Code: Full  PPx: SCDs  Dispo: DC home.    35 min spent on floor coordinating dc.    Vernetta Honey, DO  MPL [phone]  ________________________________________________________________________    Subjective  Hannah Juarez is a 21 y.o. female.  Pain better controlled.  BM this AM.  Eating well.  Ambulating without difficulty.    No chest pain, shortness of breath, nausea, vomiting.  No dysuria.    Medications  Scheduled Meds:    ferrous gluconate tablet 324 mg 324 mg Oral QHS   polyethylene glycol 3350 (MIRALAX) packet 17 g 1 packet Oral QDAY   senna (SENOKOT) tablet 1 tablet 1 tablet Oral BID   Continuous Infusions:    PRN and Respiratory Meds:morphine  injection syringe Q3H PRN, oxyCODONE Q4H PRN, phenol PRN, prochlorperazine Q6H PRN    Objective:                          Vital Signs: Last Filed                 Vital Signs: 24 Hour Range   BP: 114/59 (09/06 0424)  Temp: 36.9 ???C (98.4 ???F) (09/06 0424)  Pulse: 98 (09/06 0424)  Respirations: 16 PER MINUTE (09/06 0424)  SpO2:  96 % (09/06 0424)  O2 Delivery: None (Room Air) (09/06 0424) BP: (104-124)/(59-74)   Temp:  [36.9 ???C (98.4 ???F)-37.4 ???C (99.4 ???F)]   Pulse:  [98-116]   Respirations:  [16 PER MINUTE-18 PER MINUTE]   SpO2:  [93 %-98 %]   O2 Delivery: None (Room Air)   Intensity Pain Scale 0-10 (Pain 1): 8 (03/16/17 0424) Vitals:    03/11/17 0522   Weight: 111.8 kg (246 lb 8 oz)       Intake/Output Summary:  (Last 24 hours)    Intake/Output Summary (Last 24 hours) at 03/16/17 1610  Last data filed at 03/16/17 0425   Gross per 24 hour   Intake              600 ml   Output             1750 ml   Net            -1150 ml      Stool Occurrence: 0    Physical Exam  General: Alert, cooperative, no distress  Resp: Clear to auscultation bilaterally   CV: Regular rhythm, tachycardic, S1, S2 normal, no murmur.  Trace pedal edema. GI: Soft, large diagonal incision w/o erythema or drainage, TTP at incision site in RUQ, no rebound or guarding.  Bowel sounds normal x4.  Neuro: No focal deficits, CN 2-12 grossly intact bilaterally, oriented x3      Lab Review  Pertinent labs reviewed    Point of Care Testing  (Last 24 hours)  Glucose: 77 (03/15/17 0630)    Radiology and other Diagnostics Review:    Pertinent radiology reviewed.

## 2017-03-16 NOTE — Progress Notes
Hannah CruiseXiomara Juarez discharged on 03/16/2017.   Marland Kitchen.  Discharge instructions reviewed with patient.  Valuables returned:   Personal Items / Valuables: Eyeglasses/Contacts, Cell Phone  Where Are Valuables Stored?: bedside.  Home medications:    n/a  Functional assessment at discharge complete: Yes .  Discharge instructions reviewed with patient. AVS and paper Rx provided. All questions and concerns addressed. Peripheral IV removed. Wheelchair to lobby when ride arrives.

## 2017-03-16 NOTE — Discharge Instructions - Pharmacy
Physician Discharge Summary      Name: Hannah Juarez  Medical Record Number: [id number]        Account Number:  [id number]  Date Of Birth:  [birth year]                         Age:  21 years   Admit date:  03/11/2017                     Discharge date:  03/16/2017    Attending Physician:  Vernetta Honey, DO               Service: Med Private L- [phone]    Physician Summary completed by: Vernetta Honey, DO    Reason for hospitalization: Sepsis due to choledocholithiasis with cholecystitis and gallstone pancreatitis.    Significant PMH: History reviewed. No pertinent past medical history.      Allergies: Patient has no known allergies.    Admission Physical Exam notable for:    BP: 126/66 (09/01 0522)  Temp: 39.4 ???C (102.9 ???F) (09/01 0522)  Pulse: 131 (09/01 0522)  Respirations: 20 PER MINUTE (09/01 0522)  SpO2: 93 % (09/01 0522)  O2 Delivery: None (Room Air) (09/01 0522)  Height: 167.6 cm (66) (09/01 0522) BP: (126)/(66)   Temp:  [39.4 ???C (102.9 ???F)]   Pulse:  [131]   Respirations:  [20 PER MINUTE]   SpO2:  [93 %]   O2 Delivery: None (Room Air)   Intensity Pain Scale 0-10 (Pain 1): 6 (03/11/17 0500) ???   ???  General:  Alert, cooperative, no distress, appears stated age  Head:  Normocephalic, without obvious abnormality, atraumatic  Eyes:  Conjunctivae/corneas clear.  PERRL, EOMs intact.   Neck:    Supple,  Lungs:  Clear to auscultation bilaterally  Heart:   Regular rate and rhythm, S1, S2 normal,tachycardic  Abdomen:  Soft,mild tenderness to palpation of RUQ  Extremities: Extremities normal, atraumatic, no cyanosis or edema  Peripheral pulses   2+ and symmetric, all extremities  Skin: Skin color, texture, turgor normal.  No rashes or lesions  Neurologic:   CNII - XII intact.   Psych:  Appropriate affect and mood    Admission Lab/Radiology studies notable for:   Hb 11.7, WBC 14.0, Plt 268, Na 140, K 3.5, Cl 106, CO2 24, BUN 9, Cr 0.6, Gluc 95, TB 1.8, AST 110, ALT 212, AP 194 Brief Hospital Course:  The patient was admitted and the following issues were addressed during this hospitalization: (with pertinent details).      Patient was transferred from an outside facility with gallstone pancreatitis.  She was transferred for evaluation for ERCP.  GI was consulted as well as surgery.  She underwent ERCP with choledocholithiasis noted and stone extraction.  Akinetic duct stent was also placed at this time.  Zosyn which was started at the outside hospital was continued.  She had notable improvement in her lipase to a normal level and her LFTs improved she continued to have fevers and tachycardia and leukocytosis.  She underwent laparoscopic cholecystectomy by general surgery which was converted due to complication from injury to the right hepatic artery.  This was repaired.  The pain was initially controlled with IV pain medication.  Her diet was advanced and she tolerated a regular diet prior to discharge.  She also off IV pain medicine for discharge.  Her JP drain was placed and surgery was removed prior to discharge however her staples  will remain in place.  She has follow-up with surgery on September 20 with these will be removed.  GI recommended that she had a repeat KUB in 2 weeks to make sure that her PD stent has been passed.  An ambulatory referral for the gastroenterology clinic was placed for follow-up with this.  If it is not passed she may need a procedure for extraction of the stent.  She was given a short prescription of oxycodone.      Condition at Discharge: Stable    Discharge Diagnoses:      Hospital Problems        Active Problems    * (Principal)Pancreatitis due to biliary obstruction    Sepsis (HCC)    Choledocholithiasis with acute cholecystitis    Hepatic artery injury       Resolved Problems    RESOLVED: Cholecystitis          Surgical Procedures: 1. Laparoscopic converted to open cholecystectomy.   2. Intraoperative cholangiogram. 3. Primary repair of right hepatic artery.    Significant Diagnostic Studies and Procedures: noted in brief hospital course    Consults:  General Surgery and GI    Patient Disposition: Home       Patient instructions/medications:     AMB REFERRAL TO GASTROENTEROLOGY   Referral Priority: Urgent Referral Type: Consult, Test & Treat   Referral Reason: Specialty Services Required    Number of Visits Requested: 1 Expiration Date: 03/16/18     Activity as Tolerated   It is important to keep increasing your activity level after you leave the hospital.  Moving around can help prevent blood clots, lung infection (pneumonia) and other problems.  Gradually increasing the number of times you are up moving around will help you return to your normal activity level more quickly.  Continue to increase the number of times you are up to the chair and walking daily to return to your normal activity level. Begin to work toward your normal activity level at discharge     Lifting Restrictions   Do not lift more than 15 pounds until after follow-up appointment with surgery.     INSTRUCTIONS, ADDITIONAL   You had a stent placed in your pancreatic duct by the GI doctors.  A follow up appointment was requested and you should be called to schedule this.  You will need an xray of your abdomen in 2 weeks to verify that you have passed the stent.  If the stent has not passed, you may need a repeat ERCP to remove the stent.  This will be discussed at your GI appointment.    If you do not hear from GI in the next week, please call [phone] and ask to be connected to [employer].     Report These Signs and Symptoms   Please contact your doctor if you have any of the following symptoms: temperature higher than 100 degrees F, uncontrolled pain, persistent nausea and/or vomiting, difficulty breathing, headache, unable to urinate, unable to have bowel movement or drainage with a foul odor     Questions About Your Stay For questions or concerns regarding your hospital stay:    - DURING BUSINESS HOURS (8:00 AM - 4:30 PM):    Call [phone] and asked to be transferred to your discharge attending physician.    - AFTER BUSINESS HOURS (4:30 PM - 8:00 AM, on weekends, or holidays):  Call [phone] and ask the operator to page the on-call doctor for the discharge attending  physician.   Discharging attending physician: Vernetta Honey [[id number]]      Regular Diet   You have no dietary restriction. Please continue with a healthy balanced diet.     Wound Care   Keep wound clean and dry.  Do not submerge under water (i.e. take baths or swim in pools).  It is ok to shower.     Return Appointment   03/30/2017  9:00 AM    [facility]         [facility]       [facility]     Opioid (Narcotic) Safety Information   OPIOID (NARCOTIC) PAIN MEDICATION SAFETY    We care about your comfort, and believe you need opioid medications at this time to treat your pain.  An opioid is a strong pain medication.  It is only available by prescription for moderate to severe pain.  Usually these medications are used for only a short time to treat pain, but sometimes will be prescribed for longer.  Talk with your doctor or nurse about how long they expect you to need this medication.    When used the right way, opioids are safe and effective medications to treat your pain, even when used for a long time.  Yet, when used in the wrong way, opioids can be dangerous for you or others.  Opioids do not work for everyone.  Most patients do not get full relief of their pain from opioid medication; full relief of your pain may not be possible.     For your safety, we ask you to follow these instructions:    *Only take your opioid medication as prescribed.  If your pain is not controlled with the prescribed dose, or the medication is not lasting long enough, call your doctor.  *Do not break or crush your opioid medication unless your doctor or pharmacist says you can.  With certain medications, this can be dangerous, and may cause death.  *Never share your medications with others, even if they appear to have a good reason.  Never take someone else's pain medication-this is dangerous, and illegal (a crime).  Overdoses and deaths have occurred.  *Keep your opioid medications safe, as you would with cash, in a lock box or similar container.  *Make sure your opioids are going to be secure, especially if you are around children or teens.  *Talk with your doctor or pharmacist before you take other medications.  *Avoid driving, operating machinery, or drinking alcohol while taking opioid pain medication.  This may be unsafe.    Pain medications can cause constipation. Constipation is bowel movements that are less often than normal. Stools often become very hard and difficult to pass. This may lead to stomach pain and bloating. It may also cause pain when trying to use the bathroom. Constipation may be treated with suppositories, laxatives or stool softeners. A diet high in fiber with plenty of fluids helps to maintain regular, soft bowel movements.        Current Discharge Medication List       START taking these medications    Details   ferrous gluconate 324 mg (37.5 mg iron) tab Take one tablet by mouth at bedtime daily.  Qty: 30 tablet, Refills: 1    PRESCRIPTION TYPE:  Normal      oxyCODONE (ROXICODONE, OXY-IR) 5 mg tablet Take 1-2 tablets as needed every 4 hours for pain.  Qty: 30 tablet, Refills: 0    PRESCRIPTION TYPE:  Print  polyethylene glycol 3350 (MIRALAX) 17 g packet Take one packet by mouth daily.  Qty: 12 each, Refills: 1    PRESCRIPTION TYPE:  Normal      senna (SENOKOT) 8.6 mg tablet Take one tablet by mouth twice daily.  Qty: 90 tablet, Refills: 3    PRESCRIPTION TYPE:  Normal          CONTINUE these medications which have NOT CHANGED    Details   ibuprofen (MOTRIN) 800 mg tablet Take 800 mg by mouth every 8 hours as needed for Pain. Take with food.    PRESCRIPTION TYPE:  Historical Med      simethicone (MYLICON) 80 mg chew tablet Chew 80 mg by mouth daily.    PRESCRIPTION TYPE:  Historical Med      vitamins, prenatal w/iron & folate 65/1 mg tab Take 1 tablet by mouth daily.    PRESCRIPTION TYPE:  Historical Med          The following medications were removed from your list. This list includes medications discontinued this stay and those removed from your prior med list in our system        HYDROcodone/acetaminophen (NORCO) 5/325 mg tablet               Scheduled appointments:    Mar 30, 2017  9:00 AM CDT  Post - Op with [facility]  [facility] ([facility]) [facility]  [address]  [place] [address]  [phone]          Pending items needing follow up:   - Ambulatory referral for GI.  Repeat KUB in 2 weeks to confirm passage of PD stent.    Signed:  Vernetta Honey, DO  03/16/2017      cc:  Primary Care Physician:  No Pcp, Na   PCP Unknown  Referring physicians:  Self, Referral   Additional provider(s):

## 2017-03-17 LAB — CULTURE-BLOOD W/SENSITIVITY

## 2017-03-19 LAB — CULTURE-BLOOD W/SENSITIVITY

## 2017-03-20 ENCOUNTER — Encounter: Admit: 2017-03-20 | Discharge: 2017-03-20 | Payer: BC Managed Care – PPO

## 2017-03-27 ENCOUNTER — Encounter: Admit: 2017-03-27 | Discharge: 2017-03-27 | Payer: BC Managed Care – PPO

## 2017-03-27 DIAGNOSIS — T85528A Displacement of other gastrointestinal prosthetic devices, implants and grafts, initial encounter: Principal | ICD-10-CM

## 2017-03-27 NOTE — Telephone Encounter
Called and spoke to the pt. Informed her that she needs to come in for a KUB to see if the stent was passed. Pt verbalized understanding. She will plan on coming to get the KUB this week. Orders placed.

## 2017-03-30 ENCOUNTER — Ambulatory Visit: Admit: 2017-03-30 | Discharge: 2017-03-30 | Payer: BC Managed Care – PPO

## 2017-03-30 ENCOUNTER — Encounter: Admit: 2017-03-30 | Discharge: 2017-03-30 | Payer: BC Managed Care – PPO

## 2017-03-30 DIAGNOSIS — Z9889 Other specified postprocedural states: Principal | ICD-10-CM

## 2017-03-30 NOTE — Progress Notes
Date of Service: 03/30/2017/    Subjective:             Hannah Juarez is a 21 y.o. female who had ERCP on 03/11/2017 and laparoscopic cholecystectomy converted to open on 03/12/2017 and presents to clinic today for follow up.    History of Present Illness  Hannah Juarez is a 21 y.o. female who had ERCP on 03/11/2017 and laparoscopic cholecystectomy converted to open on 03/12/2017 and presents to clinic today for follow up.  Postoperatively she has been doing well.  She denies any pain, fevers or chills, or nausea or vomiting.  She is tolerating a normal diet.  She is passing flatus and having regular bowel movements.  She denies any drainage or erythema around the surgical wounds.  She is aware of her postoperative lifting restrictions and is otherwise back to her normal life.     Review of Systems   Constitutional: Negative for activity change, chills and fever.   HENT: Negative for sore throat.    Eyes: Negative for visual disturbance.   Respiratory: Negative for shortness of breath.    Cardiovascular: Negative for chest pain.   Gastrointestinal: Negative for abdominal distention, abdominal pain, blood in stool, constipation, diarrhea, nausea and vomiting.   Genitourinary: Negative for difficulty urinating.   Musculoskeletal: Negative for arthralgias.   Skin: Negative for rash.   Neurological: Negative for weakness.         Objective:         ??? ibuprofen (MOTRIN) 800 mg tablet Take 800 mg by mouth every 8 hours as needed for Pain. Take with food.   ??? senna (SENOKOT) 8.6 mg tablet Take one tablet by mouth twice daily.   ??? vitamins, prenatal w/iron & folate 65/1 mg tab Take 1 tablet by mouth daily.     Vitals:    03/30/17 0911   BP: 130/62   Pulse: 91   Resp: 18   Temp: 36.3 ???C (97.4 ???F)   TempSrc: Oral   Weight: 104.6 kg (230 lb 9.6 oz)   Height: 167.6 cm (66)     Body mass index is 37.22 kg/m???.     Physical Exam   Constitutional: She is oriented to person, place, and time. She appears well-developed and well-nourished. No distress.   HENT:   Head: Normocephalic and atraumatic.   Eyes: EOM are normal.   Neck: Normal range of motion.   Cardiovascular: Normal rate and regular rhythm.    Pulmonary/Chest: Effort normal. No respiratory distress.   Abdominal: Soft. She exhibits no distension. There is no tenderness. There is no rebound and no guarding. No hernia.   abodminal surgical incision well healed without erythema or drainage with staples in place.    Musculoskeletal: Normal range of motion.   Neurological: She is alert and oriented to person, place, and time. No cranial nerve deficit.   Skin: Skin is warm and dry.   Psychiatric: She has a normal mood and affect. Her behavior is normal.     Final Diagnosis:     A. Gallbladder, gallbladder, cholecystectomy:   Chronic cholecystitis with cholesterolosis, small cholesterol polyp,   and cholelithiasis.       Attestation:   By this signature, I attest that I have personally formulated the final   interpretation expressed in this report and that the above diagnosis is   based upon my examination of the slides and/or other material indicated in   this report.     +++Electronically Signed Out By Lanora Manis  Zachery Conch, MD on 03/15/2017+++   ??? ??? ??? ??? ???   ksw/03/15/2017        Assessment and Plan:  Hannah Juarez is a 21 y.o. female who had ERCP on 03/11/2017 and laparoscopic cholecystectomy converted to open on 03/12/2017 and presents to clinic today for follow up. She is doing well post-operatively.  Staples removed today. Incision is well healed.  She no longer requires follow-up. She was counseled on red flag symptoms and if she has any questions in the future she is welcome to call the clinic.            ATTESTATION    I personally observed the resident performing the E/M, discussed case with resident, and concur with resident documentation of history, physical assessment and treatment plan unless otherwise noted. Staff name:  Verdie Mosher, MD Date:  03/30/2017

## 2017-04-04 ENCOUNTER — Encounter: Admit: 2017-04-04 | Discharge: 2017-04-04 | Payer: BC Managed Care – PPO

## 2017-04-25 ENCOUNTER — Encounter: Admit: 2017-04-25 | Discharge: 2017-04-25 | Payer: BC Managed Care – PPO

## 2017-04-25 DIAGNOSIS — Z9889 Other specified postprocedural states: Principal | ICD-10-CM

## 2017-10-12 ENCOUNTER — Encounter: Admit: 2017-10-12 | Discharge: 2017-10-12 | Payer: BC Managed Care – PPO
